# Patient Record
Sex: Female | Born: 1985 | Hispanic: Yes | Marital: Married | Smoking: Former smoker
Health system: Southern US, Community
[De-identification: ages and names within clinical notes are randomized; demographics above are authoritative.]

## PROBLEM LIST (undated history)

## (undated) DIAGNOSIS — Z22330 Carrier of Group B streptococcus: Secondary | ICD-10-CM

## (undated) DIAGNOSIS — K219 Gastro-esophageal reflux disease without esophagitis: Secondary | ICD-10-CM

## (undated) DIAGNOSIS — E559 Vitamin D deficiency, unspecified: Secondary | ICD-10-CM

## (undated) DIAGNOSIS — F419 Anxiety disorder, unspecified: Secondary | ICD-10-CM

## (undated) DIAGNOSIS — K5900 Constipation, unspecified: Secondary | ICD-10-CM

## (undated) DIAGNOSIS — B977 Papillomavirus as the cause of diseases classified elsewhere: Secondary | ICD-10-CM

## (undated) DIAGNOSIS — Z8619 Personal history of other infectious and parasitic diseases: Secondary | ICD-10-CM

## (undated) DIAGNOSIS — N760 Acute vaginitis: Secondary | ICD-10-CM

## (undated) DIAGNOSIS — R896 Abnormal cytological findings in specimens from other organs, systems and tissues: Secondary | ICD-10-CM

## (undated) DIAGNOSIS — IMO0002 Reserved for concepts with insufficient information to code with codable children: Secondary | ICD-10-CM

## (undated) DIAGNOSIS — Z87898 Personal history of other specified conditions: Secondary | ICD-10-CM

## (undated) DIAGNOSIS — A630 Anogenital (venereal) warts: Secondary | ICD-10-CM

## (undated) DIAGNOSIS — B9689 Other specified bacterial agents as the cause of diseases classified elsewhere: Secondary | ICD-10-CM

## (undated) DIAGNOSIS — N926 Irregular menstruation, unspecified: Secondary | ICD-10-CM

## (undated) HISTORY — DX: Papillomavirus as the cause of diseases classified elsewhere: B97.7

## (undated) HISTORY — DX: Other specified bacterial agents as the cause of diseases classified elsewhere: B96.89

## (undated) HISTORY — DX: Abnormal cytological findings in specimens from other organs, systems and tissues: R89.6

## (undated) HISTORY — DX: Constipation, unspecified: K59.00

## (undated) HISTORY — DX: Carrier of group B Streptococcus: Z22.330

## (undated) HISTORY — DX: Acute vaginitis: N76.0

## (undated) HISTORY — DX: Vitamin D deficiency, unspecified: E55.9

## (undated) HISTORY — DX: Anxiety disorder, unspecified: F41.9

## (undated) HISTORY — DX: Irregular menstruation, unspecified: N92.6

## (undated) HISTORY — DX: Reserved for concepts with insufficient information to code with codable children: IMO0002

## (undated) HISTORY — PX: WISDOM TOOTH EXTRACTION: SHX21

## (undated) HISTORY — DX: Personal history of other specified conditions: Z87.898

## (undated) HISTORY — DX: Gastro-esophageal reflux disease without esophagitis: K21.9

## (undated) HISTORY — DX: Personal history of other infectious and parasitic diseases: Z86.19

## (undated) HISTORY — DX: Anogenital (venereal) warts: A63.0

---
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
## 2007-04-20 ENCOUNTER — Inpatient Hospital Stay (HOSPITAL_COMMUNITY): Admission: AD | Admit: 2007-04-20 | Discharge: 2007-04-22 | Payer: Self-pay | Admitting: Family Medicine

## 2007-10-02 DIAGNOSIS — N760 Acute vaginitis: Secondary | ICD-10-CM | POA: Insufficient documentation

## 2007-10-02 DIAGNOSIS — B9689 Other specified bacterial agents as the cause of diseases classified elsewhere: Secondary | ICD-10-CM

## 2007-10-02 HISTORY — DX: Other specified bacterial agents as the cause of diseases classified elsewhere: B96.89

## 2007-10-02 HISTORY — DX: Other specified bacterial agents as the cause of diseases classified elsewhere: N76.0

## 2008-08-23 DIAGNOSIS — A630 Anogenital (venereal) warts: Secondary | ICD-10-CM

## 2008-08-23 HISTORY — DX: Anogenital (venereal) warts: A63.0

## 2009-01-17 HISTORY — PX: COLPOSCOPY: SHX161

## 2010-04-10 ENCOUNTER — Emergency Department (HOSPITAL_COMMUNITY): Admission: EM | Admit: 2010-04-10 | Discharge: 2010-04-10 | Payer: Self-pay | Admitting: Emergency Medicine

## 2010-11-06 LAB — WET PREP, GENITAL
Trich, Wet Prep: NONE SEEN
Yeast Wet Prep HPF POC: NONE SEEN

## 2010-11-06 LAB — GC/CHLAMYDIA PROBE AMP, GENITAL: GC Probe Amp, Genital: NEGATIVE

## 2010-11-06 LAB — POCT PREGNANCY, URINE: Preg Test, Ur: NEGATIVE
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
## 2011-01-08 NOTE — Op Note (Signed)
Sandra Alvarado, Sandra Alvarado                ACCOUNT NO.:  [id number]   MEDICAL RECORD NO.:  [id number]          PATIENT TYPE:  OIB   LOCATION:  2859                         FACILITY:  [facility]   PHYSICIAN:  Jon Gills, M.D.  DATE OF BIRTH:  [birth year]   DATE OF PROCEDURE:  12/25/2004  DATE OF DISCHARGE:  12/25/2004                                 OPERATIVE REPORT   PREOPERATIVE DIAGNOSIS:  Epigastric abdominal pain.   POSTOPERATIVE DIAGNOSIS:  Epigastric abdominal pain.   NAME OF OPERATION:  Upper GI endoscopy with biopsy.   SURGEON:  Jon Gills, M.D.   ASSISTANTS:  None.   DESCRIPTION OF FINDINGS:  Following informed written consent, the patient  was taken to the operating room and placed under general anesthesia, with  continuous cardiopulmonary monitoring.  She remained in the supine position,  and the Olympus endoscope was advanced without difficulty.  There was no  endoscopic evidence for gastritis, duodenitis, esophagitis, or peptic ulcer  disease.  A solitary gastric biopsy was negative for Helicobacter.  Several  duodenal biopsies were unremarkable.  The endoscope was gradually withdrawn,  and the patient was taken to the recovery room in satisfactory condition.  She will be released to the care of her parents later today.   DESCRIPTION OF TECHNICAL PROCEDURES USED:  Olympus GIF-160 endoscope with  cold biopsy forceps.   DESCRIPTION OF SPECIMENS REMOVED:  1.  Gastric x 1 for CLO.  2.  Duodenal x 4 for histology.      JHC/MEDQ  D:  01/08/2005  T:  01/08/2005  Job:  [id number]   cc:   Caleb Popp, MD  Fax: [phone]

## 2011-01-23 ENCOUNTER — Inpatient Hospital Stay (INDEPENDENT_AMBULATORY_CARE_PROVIDER_SITE_OTHER)
Admission: RE | Admit: 2011-01-23 | Discharge: 2011-01-23 | Disposition: A | Discharge status: Home / Self Care | Payer: 59 | Source: Ambulatory Visit | Attending: Family Medicine | Admitting: Family Medicine

## 2011-01-23 DIAGNOSIS — N12 Tubulo-interstitial nephritis, not specified as acute or chronic: Secondary | ICD-10-CM

## 2011-01-23 LAB — POCT URINALYSIS DIP (DEVICE)
Glucose, UA: NEGATIVE mg/dL
Protein, ur: NEGATIVE mg/dL
Specific Gravity, Urine: 1.01 (ref 1.005–1.030)
pH: 5 (ref 5.0–8.0)

## 2011-01-23 LAB — DIFFERENTIAL
Basophils Absolute: 0 10*3/uL (ref 0.0–0.1)
Basophils Relative: 0 % (ref 0–1)
Eosinophils Absolute: 0 10*3/uL (ref 0.0–0.7)
Monocytes Absolute: 0.2 10*3/uL (ref 0.1–1.0)
Neutro Abs: 11.8 10*3/uL — ABNORMAL HIGH (ref 1.7–7.7)
Neutrophils Relative %: 94 % — ABNORMAL HIGH (ref 43–77)

## 2011-01-23 LAB — CBC
HCT: 36.5 % (ref 36.0–46.0)
Hemoglobin: 12.4 g/dL (ref 12.0–15.0)
MCH: 30.7 pg (ref 26.0–34.0)
MCV: 90.3 fL (ref 78.0–100.0)

## 2011-01-23 LAB — POCT PREGNANCY, URINE: Preg Test, Ur: NEGATIVE

## 2011-01-25 ENCOUNTER — Inpatient Hospital Stay (HOSPITAL_COMMUNITY)
Admission: RE | Admit: 2011-01-25 | Discharge: 2011-01-25 | Disposition: A | Discharge status: Home / Self Care | Payer: 59 | Source: Ambulatory Visit | Attending: Family Medicine | Admitting: Family Medicine

## 2011-01-26 LAB — URINE CULTURE: Culture  Setup Time: 201206030043

## 2011-06-04 LAB — CBC
HCT: 33.3 — ABNORMAL LOW
Hemoglobin: 12.8
MCHC: 33.9
RBC: 3.68 — ABNORMAL LOW
RBC: 4.17

## 2011-06-04 LAB — RPR: RPR Ser Ql: NONREACTIVE

## 2012-02-08 ENCOUNTER — Encounter: Payer: Self-pay | Admitting: Obstetrics and Gynecology

## 2012-02-08 ENCOUNTER — Ambulatory Visit (INDEPENDENT_AMBULATORY_CARE_PROVIDER_SITE_OTHER): Payer: 59 | Admitting: Obstetrics and Gynecology

## 2012-02-08 ENCOUNTER — Telehealth: Payer: Self-pay | Admitting: Obstetrics and Gynecology

## 2012-02-08 VITALS — BP 92/60 | Temp 98.0°F | Ht 61.75 in

## 2012-02-08 DIAGNOSIS — A499 Bacterial infection, unspecified: Secondary | ICD-10-CM

## 2012-02-08 DIAGNOSIS — A630 Anogenital (venereal) warts: Secondary | ICD-10-CM

## 2012-02-08 DIAGNOSIS — Z309 Encounter for contraceptive management, unspecified: Secondary | ICD-10-CM

## 2012-02-08 DIAGNOSIS — N926 Irregular menstruation, unspecified: Secondary | ICD-10-CM | POA: Insufficient documentation

## 2012-02-08 DIAGNOSIS — R87610 Atypical squamous cells of undetermined significance on cytologic smear of cervix (ASC-US): Secondary | ICD-10-CM

## 2012-02-08 DIAGNOSIS — N390 Urinary tract infection, site not specified: Secondary | ICD-10-CM

## 2012-02-08 DIAGNOSIS — Z8619 Personal history of other infectious and parasitic diseases: Secondary | ICD-10-CM

## 2012-02-08 DIAGNOSIS — N760 Acute vaginitis: Secondary | ICD-10-CM

## 2012-02-08 DIAGNOSIS — K219 Gastro-esophageal reflux disease without esophagitis: Secondary | ICD-10-CM

## 2012-02-08 DIAGNOSIS — Z113 Encounter for screening for infections with a predominantly sexual mode of transmission: Secondary | ICD-10-CM

## 2012-02-08 DIAGNOSIS — B9689 Other specified bacterial agents as the cause of diseases classified elsewhere: Secondary | ICD-10-CM

## 2012-02-08 DIAGNOSIS — IMO0001 Reserved for inherently not codable concepts without codable children: Secondary | ICD-10-CM | POA: Insufficient documentation

## 2012-02-08 DIAGNOSIS — Z30432 Encounter for removal of intrauterine contraceptive device: Secondary | ICD-10-CM

## 2012-02-08 DIAGNOSIS — Z22330 Carrier of Group B streptococcus: Secondary | ICD-10-CM

## 2012-02-08 DIAGNOSIS — B977 Papillomavirus as the cause of diseases classified elsewhere: Secondary | ICD-10-CM | POA: Insufficient documentation

## 2012-02-08 LAB — POCT URINALYSIS DIPSTICK
Bilirubin, UA: NEGATIVE
Glucose, UA: NEGATIVE
Urobilinogen, UA: NEGATIVE

## 2012-02-08 MED ORDER — CIPROFLOXACIN HCL 500 MG PO TABS: 500.0000 mg | ORAL_TABLET | Freq: Two times a day (BID) | ORAL | Status: AC

## 2012-02-08 NOTE — Progress Notes (Signed)
26 YO for IUD removal as it is due to be removed in October, to be checked for a UTI  (pressure with urination and dysuria x 2 days) and is requesting STD testing. Denies vaginitis symptoms or pelvic pain.  Does not want contraception   O: U/A= SG=1.020, pH=5,  leuk->3, nitrite +,  protein 1+, blood 2+    UPT-negative  Abdomen: soft, non-tender Pelvic: EGBUS-wnl, vagina-normal rugae, scant blood, cervix- IUD removed without difficulty (cervix displaced to the right), uterus-normal size, adnexae-no tenderness   A:  IUD Removed      UTI  P: Patient declines contraception       Cipro 500 mg #6 bid x 3 days       MVI with 400 mcg of folate       RTO-AEx or prn  Sandra Jorgenson, PA-C

## 2012-02-08 NOTE — Telephone Encounter (Signed)
Tc to pt per telephone call. Pt with questions rgdg diagnosis found on AVS sheet today. Questions answered rgdg HPV and GBS. Pt voices understanding.

## 2012-02-08 NOTE — Patient Instructions (Signed)
Take  Multivitamin with 400 mcg of folate or more

## 2012-02-08 NOTE — Telephone Encounter (Signed)
Triage/ph had appt today

## 2012-02-09 LAB — HEPATITIS B SURFACE ANTIGEN: Hepatitis B Surface Ag: NEGATIVE

## 2012-02-09 LAB — GC/CHLAMYDIA PROBE AMP, GENITAL: Chlamydia, DNA Probe: NEGATIVE

## 2012-02-09 LAB — HIV ANTIBODY (ROUTINE TESTING W REFLEX): HIV: NONREACTIVE

## 2012-02-12 LAB — URINE CULTURE

## 2012-02-22 ENCOUNTER — Telehealth: Payer: Self-pay | Admitting: Obstetrics and Gynecology

## 2012-02-22 NOTE — Telephone Encounter (Signed)
Lm for pt to call back

## 2012-02-22 NOTE — Telephone Encounter (Signed)
Triage/res. °

## 2012-02-25 ENCOUNTER — Telehealth: Payer: Self-pay | Admitting: Obstetrics and Gynecology

## 2012-02-25 NOTE — Telephone Encounter (Signed)
TC TO PT CONCERNING LAB RESULTS. INFORMED PT THAT ALL TEST WERE WNL EXCEPT FOR URINE CULTURE WHICH SHE HAS ALREADY BEEN TREATED FOR. PT VOICED UNDERSTANDING.

## 2012-09-07 ENCOUNTER — Ambulatory Visit: Payer: 59 | Admitting: Obstetrics and Gynecology

## 2012-09-07 ENCOUNTER — Encounter: Payer: Self-pay | Admitting: Obstetrics and Gynecology

## 2012-09-07 VITALS — BP 92/62 | Temp 98.5°F | Ht 61.0 in | Wt 130.0 lb

## 2012-09-07 DIAGNOSIS — N870 Mild cervical dysplasia: Secondary | ICD-10-CM

## 2012-09-07 DIAGNOSIS — IMO0002 Reserved for concepts with insufficient information to code with codable children: Secondary | ICD-10-CM

## 2012-09-07 NOTE — Progress Notes (Signed)
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  Patient has also requested to be tested for infertility because she has not conceived after not having used any contraception.  Since October cycle is 25-30 day cycle, menses 5 days with protection change twice a day, cramps rated as a 4-5/10 on a 10 point pain scale-relieved with Tylenol   O: Pelvic: EGBUS-wnl. vagina-normal, cervix-slightly posterior with no lesions, uterus/adnexae-normal  A: Repeat PAP     CIN-1     Perceived infertility  P:  PAP- sent       Reviewed infertility w/u:  SA, HSG, TSH/PRL & day 21 Progesterone       RTO-as needed or scheduled  Jhovanny Guinta, AP-C

## 2012-09-08 LAB — PAP IG W/ RFLX HPV ASCU

## 2012-09-13 ENCOUNTER — Telehealth: Payer: Self-pay

## 2012-09-13 NOTE — Telephone Encounter (Signed)
Pt called to schedule colpo. Pt not ava. LMOVM for pt to call back.

## 2012-09-13 NOTE — Telephone Encounter (Signed)
colpo scheduled for 10/10/12  Pt agreeable  LC CMA

## 2012-09-23 DIAGNOSIS — R87619 Unspecified abnormal cytological findings in specimens from cervix uteri: Secondary | ICD-10-CM

## 2012-09-23 DIAGNOSIS — IMO0002 Reserved for concepts with insufficient information to code with codable children: Secondary | ICD-10-CM

## 2012-09-23 HISTORY — DX: Reserved for concepts with insufficient information to code with codable children: IMO0002

## 2012-09-23 HISTORY — DX: Unspecified abnormal cytological findings in specimens from cervix uteri: R87.619

## 2012-10-10 ENCOUNTER — Encounter: Payer: Self-pay | Admitting: Obstetrics and Gynecology

## 2012-10-10 ENCOUNTER — Ambulatory Visit: Payer: 59 | Admitting: Obstetrics and Gynecology

## 2012-10-10 VITALS — BP 100/54 | Ht 61.5 in | Wt 129.0 lb

## 2012-10-10 DIAGNOSIS — B977 Papillomavirus as the cause of diseases classified elsewhere: Secondary | ICD-10-CM

## 2012-10-10 DIAGNOSIS — Z3169 Encounter for other general counseling and advice on procreation: Secondary | ICD-10-CM

## 2012-10-10 DIAGNOSIS — IMO0001 Reserved for inherently not codable concepts without codable children: Secondary | ICD-10-CM

## 2012-10-10 NOTE — Progress Notes (Signed)
HISTORY OF PRESENT ILLNESS  [name] is a 27 y.o. year old female,G1P1001, who presents for a problem visit.  In 2010 the patient had colposcopically directed biopsies that showed CIN-1 and HPV infection.  Her Pap smear from January 2014 showed ascus with high risk HPV.  The patient has tried to get pregnant for the last 9 months without success.  Subjective:  The patient is more concerned about her abnormal Pap smear  Objective:  BP 100/54  Ht 5' 1.5" (1.562 m)  Wt 129 lb (58.514 kg)  BMI 23.98 kg/m2  LMP 09/21/2012   General: hirsutism GI: soft and nontender  External genitalia: normal general appearance Vaginal: normal without tenderness, induration or masses Cervix: see colposcopy note Adnexa: normal bimanual exam Uterus: normal size shape and consistency  Pregnancy test: Negative  COLPOSCOPY NOTE:  The colposcopy procedure was explained.  The patient's questions were answered. A speculum exam was performed.  The cervix was prepped with acetic acid and Hurricaine gel.  The cervix was evaluated using a white light and the green filter. Findings: white epithelium at 10:00 .  The endocervical canal was clear.  No lesions were seen.  Biopsies obtained: biopsy at 10:00.  Hemostasis was adequate.  An endocervical curettage was performed.  Again, hemostasis was adequate. The procedure was terminated.  The patient tolerated her procedure well.  The specimens were sent to pathology.  Assessment:  Ascus Pap smear  History of CIN-1  HPV  Infertility  Plan:  Biopsy at 10:00 and ECC sent to pathology.  HPV discussed with patient and her husband.  Infertility reviewed.  Management outlined.  Return to office in 2 weeks to discuss test results.  Leonard Schwartz M.D.  10/10/2012 6:15 PM

## 2012-10-19 ENCOUNTER — Encounter: Payer: Self-pay | Admitting: Obstetrics and Gynecology

## 2012-10-24 ENCOUNTER — Encounter: Payer: Self-pay | Admitting: Obstetrics and Gynecology

## 2012-10-24 ENCOUNTER — Ambulatory Visit: Payer: 59 | Admitting: Obstetrics and Gynecology

## 2012-10-24 VITALS — BP 100/70 | Wt 131.0 lb

## 2012-10-24 DIAGNOSIS — N870 Mild cervical dysplasia: Secondary | ICD-10-CM

## 2012-10-24 NOTE — Progress Notes (Signed)
27 YO with recent colposcopy for LGSIL with pathology results showing CIN-I & HPV returns for results.  Reviewed with patient and her partner HPV and abnormal PAP follow up for her abnormality.  A: CIN-I  P: Repeat PAP in 6 months       Urged MVI with 400 mcg of folic acid or more (couple is not contracepting)       RTO-as scheduled or prn  POWELL,ELMIRA, PA-C

## 2014-06-24 ENCOUNTER — Encounter: Payer: Self-pay | Admitting: Obstetrics and Gynecology

## 2014-12-24 ENCOUNTER — Other Ambulatory Visit: Payer: Self-pay | Admitting: Obstetrics and Gynecology

## 2015-03-19 ENCOUNTER — Other Ambulatory Visit: Payer: Self-pay | Admitting: Obstetrics and Gynecology

## 2015-03-19 ENCOUNTER — Encounter (HOSPITAL_COMMUNITY): Payer: Self-pay | Admitting: *Deleted

## 2015-03-21 ENCOUNTER — Ambulatory Visit (HOSPITAL_COMMUNITY)
Admission: RE | Admit: 2015-03-21 | Discharge: 2015-03-21 | Disposition: A | Discharge status: Home / Self Care | Payer: 59 | Source: Ambulatory Visit | Attending: Obstetrics and Gynecology | Admitting: Obstetrics and Gynecology

## 2015-03-21 ENCOUNTER — Ambulatory Visit (HOSPITAL_COMMUNITY): Payer: 59

## 2015-03-21 ENCOUNTER — Encounter (HOSPITAL_COMMUNITY): Payer: Self-pay

## 2015-03-21 ENCOUNTER — Encounter (HOSPITAL_COMMUNITY)
Admission: RE | Disposition: A | Discharge status: Home / Self Care | Payer: Self-pay | Source: Ambulatory Visit | Attending: Obstetrics and Gynecology

## 2015-03-21 ENCOUNTER — Ambulatory Visit (HOSPITAL_COMMUNITY): Payer: 59 | Admitting: Anesthesiology

## 2015-03-21 DIAGNOSIS — O021 Missed abortion: Secondary | ICD-10-CM | POA: Diagnosis present

## 2015-03-21 DIAGNOSIS — Z22330 Carrier of Group B streptococcus: Secondary | ICD-10-CM | POA: Diagnosis not present

## 2015-03-21 DIAGNOSIS — O029 Abnormal product of conception, unspecified: Secondary | ICD-10-CM

## 2015-03-21 DIAGNOSIS — R87610 Atypical squamous cells of undetermined significance on cytologic smear of cervix (ASC-US): Secondary | ICD-10-CM | POA: Diagnosis not present

## 2015-03-21 DIAGNOSIS — K219 Gastro-esophageal reflux disease without esophagitis: Secondary | ICD-10-CM | POA: Insufficient documentation

## 2015-03-21 DIAGNOSIS — Z87891 Personal history of nicotine dependence: Secondary | ICD-10-CM | POA: Diagnosis not present

## 2015-03-21 HISTORY — PX: DILATION AND EVACUATION: SHX1459

## 2015-03-21 LAB — CBC
HCT: 38 % (ref 36.0–46.0)
Hemoglobin: 13.3 g/dL (ref 12.0–15.0)
MCH: 31.3 pg (ref 26.0–34.0)
MCHC: 35 g/dL (ref 30.0–36.0)
MCV: 89.4 fL (ref 78.0–100.0)
Platelets: 228 10*3/uL (ref 150–400)
RBC: 4.25 MIL/uL (ref 3.87–5.11)
RDW: 12.2 % (ref 11.5–15.5)
WBC: 7 10*3/uL (ref 4.0–10.5)

## 2015-03-21 SURGERY — DILATION AND EVACUATION, UTERUS
Anesthesia: Monitor Anesthesia Care

## 2015-03-21 MED ORDER — EPHEDRINE 5 MG/ML INJ
INTRAVENOUS | Status: AC
  Filled 2015-03-21: qty 10

## 2015-03-21 MED ORDER — DEXAMETHASONE SODIUM PHOSPHATE 4 MG/ML IJ SOLN
INTRAMUSCULAR | Status: AC
  Filled 2015-03-21: qty 1

## 2015-03-21 MED ORDER — DEXAMETHASONE SODIUM PHOSPHATE 4 MG/ML IJ SOLN
INTRAMUSCULAR | Status: DC | PRN
  Administered 2015-03-21: 4 mg via INTRAVENOUS

## 2015-03-21 MED ORDER — PROPOFOL INFUSION 10 MG/ML OPTIME
INTRAVENOUS | Status: DC | PRN
  Administered 2015-03-21: 10 mL via INTRAVENOUS
  Administered 2015-03-21: 20 mL via INTRAVENOUS
  Administered 2015-03-21: 30 mL via INTRAVENOUS
  Administered 2015-03-21 (×2): 20 mL via INTRAVENOUS
  Administered 2015-03-21: 30 mL via INTRAVENOUS
  Administered 2015-03-21: 20 mL via INTRAVENOUS
  Administered 2015-03-21 (×2): 10 mL via INTRAVENOUS
  Administered 2015-03-21: 30 mL via INTRAVENOUS

## 2015-03-21 MED ORDER — LIDOCAINE HCL 2 % IJ SOLN
INTRAMUSCULAR | Status: AC
  Filled 2015-03-21: qty 20

## 2015-03-21 MED ORDER — MIDAZOLAM HCL 2 MG/2ML IJ SOLN
INTRAMUSCULAR | Status: DC | PRN
  Administered 2015-03-21: 2 mg via INTRAVENOUS

## 2015-03-21 MED ORDER — ONDANSETRON HCL 4 MG/2ML IJ SOLN
INTRAMUSCULAR | Status: AC
  Filled 2015-03-21: qty 2

## 2015-03-21 MED ORDER — PROMETHAZINE HCL 25 MG/ML IJ SOLN
6.2500 mg | INTRAMUSCULAR | Status: DC | PRN
Start: 2015-03-21 — End: 2015-03-21

## 2015-03-21 MED ORDER — SCOPOLAMINE 1 MG/3DAYS TD PT72
MEDICATED_PATCH | TRANSDERMAL | Status: AC
  Administered 2015-03-21: 1.5 mg via TRANSDERMAL
  Filled 2015-03-21: qty 1

## 2015-03-21 MED ORDER — MEPERIDINE HCL 25 MG/ML IJ SOLN: 6.2500 mg | INTRAMUSCULAR | Status: DC | PRN

## 2015-03-21 MED ORDER — DOXYCYCLINE HYCLATE 50 MG PO CAPS: 100.0000 mg | ORAL_CAPSULE | Freq: Two times a day (BID) | ORAL | Status: DC

## 2015-03-21 MED ORDER — FENTANYL CITRATE (PF) 100 MCG/2ML IJ SOLN
INTRAMUSCULAR | Status: DC | PRN
  Administered 2015-03-21 (×2): 25 ug via INTRAVENOUS
  Administered 2015-03-21: 50 ug via INTRAVENOUS

## 2015-03-21 MED ORDER — PROPOFOL 10 MG/ML IV BOLUS
INTRAVENOUS | Status: AC
  Filled 2015-03-21: qty 20

## 2015-03-21 MED ORDER — HYDROCODONE-ACETAMINOPHEN 5-325 MG PO TABS: 1.0000 | ORAL_TABLET | Freq: Four times a day (QID) | ORAL | Status: DC | PRN

## 2015-03-21 MED ORDER — ONDANSETRON HCL 4 MG/2ML IJ SOLN
INTRAMUSCULAR | Status: DC | PRN
  Administered 2015-03-21: 4 mg via INTRAVENOUS

## 2015-03-21 MED ORDER — PHENYLEPHRINE 40 MCG/ML (10ML) SYRINGE FOR IV PUSH (FOR BLOOD PRESSURE SUPPORT)
PREFILLED_SYRINGE | INTRAVENOUS | Status: AC
  Filled 2015-03-21: qty 10

## 2015-03-21 MED ORDER — MIDAZOLAM HCL 2 MG/2ML IJ SOLN
INTRAMUSCULAR | Status: AC
  Filled 2015-03-21: qty 2

## 2015-03-21 MED ORDER — FENTANYL CITRATE (PF) 100 MCG/2ML IJ SOLN
INTRAMUSCULAR | Status: AC
  Filled 2015-03-21: qty 2

## 2015-03-21 MED ORDER — LACTATED RINGERS IV SOLN
INTRAVENOUS | Status: DC
  Administered 2015-03-21 (×4): via INTRAVENOUS

## 2015-03-21 MED ORDER — IBUPROFEN 600 MG PO TABS: 600.0000 mg | ORAL_TABLET | Freq: Four times a day (QID) | ORAL | Status: DC | PRN

## 2015-03-21 MED ORDER — LIDOCAINE HCL (CARDIAC) 20 MG/ML IV SOLN
INTRAVENOUS | Status: AC
  Filled 2015-03-21: qty 5

## 2015-03-21 MED ORDER — PROPOFOL 10 MG/ML IV BOLUS
INTRAVENOUS | Status: AC
  Filled 2015-03-21: qty 40

## 2015-03-21 MED ORDER — SCOPOLAMINE 1 MG/3DAYS TD PT72
1.0000 | MEDICATED_PATCH | Freq: Once | TRANSDERMAL | Status: DC
  Administered 2015-03-21: 1.5 mg via TRANSDERMAL

## 2015-03-21 MED ORDER — LIDOCAINE HCL 2 % IJ SOLN
INTRAMUSCULAR | Status: DC | PRN
  Administered 2015-03-21: 20 mL

## 2015-03-21 MED ORDER — KETOROLAC TROMETHAMINE 30 MG/ML IJ SOLN
INTRAMUSCULAR | Status: AC
  Filled 2015-03-21: qty 1

## 2015-03-21 MED ORDER — KETOROLAC TROMETHAMINE 30 MG/ML IJ SOLN
INTRAMUSCULAR | Status: DC | PRN
  Administered 2015-03-21: 30 mg via INTRAVENOUS

## 2015-03-21 MED ORDER — FENTANYL CITRATE (PF) 100 MCG/2ML IJ SOLN
25.0000 ug | INTRAMUSCULAR | Status: DC | PRN
  Administered 2015-03-21: 50 ug via INTRAVENOUS

## 2015-03-21 MED ORDER — LIDOCAINE HCL (CARDIAC) 20 MG/ML IV SOLN
INTRAVENOUS | Status: DC | PRN
  Administered 2015-03-21: 20 mg via INTRAVENOUS

## 2015-03-21 SURGICAL SUPPLY — 20 items
CATH ROBINSON RED A/P 16FR (CATHETERS) ×2 IMPLANT
CLOTH BEACON ORANGE TIMEOUT ST (SAFETY) ×2 IMPLANT
DECANTER SPIKE VIAL GLASS SM (MISCELLANEOUS) ×2 IMPLANT
DILATOR CANAL MILEX (MISCELLANEOUS) IMPLANT
GLOVE BIO SURGEON STRL SZ 6.5 (GLOVE) ×2 IMPLANT
GLOVE BIOGEL PI IND STRL 7.0 (GLOVE) ×2 IMPLANT
GLOVE BIOGEL PI INDICATOR 7.0 (GLOVE) ×2
GOWN STRL REUS W/TWL LRG LVL3 (GOWN DISPOSABLE) ×4 IMPLANT
KIT BERKELEY 1ST TRIMESTER 3/8 (MISCELLANEOUS) ×2 IMPLANT
NS IRRIG 1000ML POUR BTL (IV SOLUTION) ×2 IMPLANT
PACK VAGINAL MINOR WOMEN LF (CUSTOM PROCEDURE TRAY) ×2 IMPLANT
PAD OB MATERNITY 4.3X12.25 (PERSONAL CARE ITEMS) ×2 IMPLANT
PAD PREP 24X48 CUFFED NSTRL (MISCELLANEOUS) ×2 IMPLANT
SET BERKELEY SUCTION TUBING (SUCTIONS) ×2 IMPLANT
SYR 20CC LL (SYRINGE) ×2 IMPLANT
TOWEL OR 17X24 6PK STRL BLUE (TOWEL DISPOSABLE) ×4 IMPLANT
VACURETTE 10 RIGID CVD (CANNULA) IMPLANT
VACURETTE 7MM CVD STRL WRAP (CANNULA) ×2 IMPLANT
VACURETTE 8 RIGID CVD (CANNULA) IMPLANT
VACURETTE 9 RIGID CVD (CANNULA) IMPLANT

## 2015-03-21 NOTE — Anesthesia Preprocedure Evaluation (Signed)
Anesthesia Evaluation  Patient identified by MRN, date of birth, ID band Patient awake    Reviewed: Allergy & Precautions, NPO status , Patient's Chart, lab work & pertinent test results, reviewed documented beta blocker date and time   Airway Mallampati: II   Neck ROM: Full    Dental  (+) Dental Advisory Given   Pulmonary former smoker (quit 01/06/2015 ?),  breath sounds clear to auscultation        Cardiovascular negative cardio ROS  Rhythm:Regular     Neuro/Psych    GI/Hepatic GERD-  ,  Endo/Other    Renal/GU      Musculoskeletal   Abdominal (+)  Abdomen: soft.    Peds  Hematology   Anesthesia Other Findings   Reproductive/Obstetrics LMP 01/01/2015                             Anesthesia Physical Anesthesia Plan  ASA: II  Anesthesia Plan: MAC   Post-op Pain Management:    Induction: Intravenous  Airway Management Planned: Mask and Nasal Cannula  Additional Equipment:   Intra-op Plan:   Post-operative Plan:   Informed Consent: I have reviewed the patients History and Physical, chart, labs and discussed the procedure including the risks, benefits and alternatives for the proposed anesthesia with the patient or authorized representative who has indicated his/her understanding and acceptance.     Plan Discussed with:   Anesthesia Plan Comments:         Anesthesia Quick Evaluation

## 2015-03-21 NOTE — Discharge Instructions (Signed)
Blighted Ovum A blighted ovum (anembryonic pregnancy) happens when a fertilized egg (embryo) attaches itself to the uterine wall, but the embryo does not develop. The pregnancy sac (placenta) continues to grow even though the embryo does not grow and develop.The pregnancy hormone is still secreted because the placenta has formed. This will result in a positive pregnancy test despite having an abnormal pregnancy. A blighted ovum occurs within the first trimester, sometimes before a woman knows she is pregnant.  CAUSES A blighted ovum is usually the result of chromosomal problems. This can be caused by abnormal cell division or poor quality sperm or egg. SYMPTOMS Early on, signs of pregnancy may be experienced, such as:  A missed menstrual period.  Fatigue.  Feeling sick to your stomach (nauseous).  Sore breasts.  A positive pregnancy test. Then, signs of miscarriage may develop, such as:  Abdominal cramps.  Vaginal bleeding or spotting.  A menstrual period that is heavier than usual. DIAGNOSIS The diagnosis of a blighted ovum is made with an ultrasound test that shows an empty uterus or an empty gestational sac. TREATMENT Your caregiver will help you decide what the best treatment is for you. Treatment for a blighted ovum includes:   Letting your body naturally pass the tissue of a blighted ovum.  Taking medicine to trigger the miscarriage.  Having a procedure called a dilation and curettage (D&C) to remove the placental tissues.  A D&C may be helpful if you would like the tissue examined to determine the reason for a miscarriage. Talk to your caregiver about the risks involved with this procedure. HOME CARE INSTRUCTIONS   Follow up with your caregiver to make sure that your pregnancy hormone returns to zero.  Wait at least 1 to 3 regular menstrual cycles before trying to get pregnant again, or as recommended by your caregiver. SEEK IMMEDIATE MEDICAL CARE IF:  You have  worsening abdominal pain.  You have very heavy bleeding or use 1 to 2 pads every hour, for more than 2 hours.  You are dizzy, feel faint, or pass out. Document Released: 11/24/2010 Document Revised: 11/01/2011 Document Reviewed: 11/24/2010 [facility] Patient Information 2015 [place], [place]. This information is not intended to replace advice given to you by your health care provider. Make sure you discuss any questions you have with your health care provider. DISCHARGE INSTRUCTIONS: D&C / D&E The following instructions have been prepared to help you care for yourself upon your return home.   Personal hygiene:  Use sanitary pads for vaginal drainage, not tampons.  Shower the day after your procedure.  NO tub baths, pools or Jacuzzis for 2-3 weeks.  Wipe front to back after using the bathroom.  Activity and limitations:  Do NOT drive or operate any equipment for 24 hours. The effects of anesthesia are still present and drowsiness may result.  Do NOT rest in bed all day.  Walking is encouraged.  Walk up and down stairs slowly.  You may resume your normal activity in one to two days or as indicated by your physician.  Sexual activity: NO intercourse for at least 2 weeks after the procedure, or as indicated by your physician.  Diet: Eat a light meal as desired this evening. You may resume your usual diet tomorrow.  Return to work: You may resume your work activities in one to two days or as indicated by your doctor.  What to expect after your surgery: Expect to have vaginal bleeding/discharge for 2-3 days and spotting for up to 10  days. It is not unusual to have soreness for up to 1-2 weeks. You may have a slight burning sensation when you urinate for the first day. Mild cramps may continue for a couple of days. You may have a regular period in 2-6 weeks.  Call your doctor for any of the following:  Excessive vaginal bleeding, saturating and changing one pad every hour.   Inability to urinate 6 hours after discharge from hospital.  Pain not relieved by pain medication.  Fever of 100.4 F or greater.  Unusual vaginal discharge or odor.   Call for an appointment:    Patients signature: ______________________  Nurses signature ________________________  Support person's signature_______________________    Post Anesthesia Home Care Instructions  Activity: Get plenty of rest for the remainder of the day. A responsible adult should stay with you for 24 hours following the procedure.  For the next 24 hours, DO NOT: -Drive a car -Advertising copywriter -Drink alcoholic beverages -Take any medication unless instructed by your physician -Make any legal decisions or sign important papers.  Meals: Start with liquid foods such as gelatin or soup. Progress to regular foods as tolerated. Avoid greasy, spicy, heavy foods. If nausea and/or vomiting occur, drink only clear liquids until the nausea and/or vomiting subsides. Call your physician if vomiting continues.  Special Instructions/Symptoms: Your throat may feel dry or sore from the anesthesia or the breathing tube placed in your throat during surgery. If this causes discomfort, gargle with warm salt water. The discomfort should disappear within 24 hours.  If you had a scopolamine patch placed behind your ear for the management of post- operative nausea and/or vomiting:  1. The medication in the patch is effective for 72 hours, after which it should be removed.  Wrap patch in a tissue and discard in the trash. Wash hands thoroughly with soap and water. 2. You may remove the patch earlier than 72 hours if you experience unpleasant side effects which may include dry mouth, dizziness or visual disturbances. 3. Avoid touching the patch. Wash your hands with soap and water after contact with the patch.

## 2015-03-21 NOTE — Op Note (Signed)
Pre op DX :  Missed abortion Postoperative Diagnosis: same Procedure:  dilation and evacuation Anesthesia:  MAC and local Surgeon:  Dr. Normand Sloop Asst : none Complications : none Procedure in detail: The patient was taken to the operating room where she was given MAC anesthesia. She was placed in dorsal lithotomy position and prepped and draped in normal sterile fashion. In and out catheter was used to drain the bladder. This was examined and noted to have a 7 week size uterus with no adnexal masses. A bivalve was placed into the vagina. A tenaculum was placed on the cervix. The cervix was infiltrated with 20 cc 1% lidocaine paracervical block. The cervix then dilated with dilators up to 21.  A size 7 suction curettage was placed into the uterine cavity. A scant amount of products of conception was seen. The suction curettage was removed when a gritty texture was noted. A sharp curette was done along all walls  of the uterus. The suction curet was placed back into the uterine cavity. No further products of conception were obtained. Sponge lap and needle counts were correct. Patient back in stable condition. The patient understood to be the risks to be, but not  limited to,  Bleeding,  Infection,  damage to internal organs by perforation of the uterus and Asherman syndrome (scarring in the uterus) leading to infertility.  The procedure was done with US guidance

## 2015-03-21 NOTE — H&P (Signed)
Sandra Alvarado is an 29 y.o. female. Presenting for D&E because of missed abortion  Pertinent Gynecological History: Menses: flow is moderate Bleeding: NA Contraception: none DES exposure: denies Blood transfusions: none Sexually transmitted diseases: no past history Previous GYN Procedures: none  Last mammogram: NA Date:  Last pap: abnormal: CIN1 Date: 2014 OB History: G2, P1   Menstrual History: Menarche age: 71  Patient's last menstrual period was 01/01/2015.    Past Medical History  Diagnosis Date  . ASCUS (atypical squamous cells of undetermined significance) on Pap smear 2008  . 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 GBS carrier   . H/O candidiasis   . H/O varicella   . Acid reflux   . Irregular periods/menstrual cycles   . H/O nausea 10/2006  . BV (bacterial vaginosis) 10/02/07  . Condyloma 2010  . Abnormal Pap smear 09/2012    Colposcopy = CIN-I    Past Surgical History  Procedure Laterality Date  . Wisdom tooth extraction    . Colposcopy  01/17/09    Family History  Problem Relation Age of Onset  . COPD Brother     Bronchitis  . Hypertension Maternal Grandmother   . Thyroid disease Maternal Grandmother   . Arthritis Maternal Grandmother   . Hypertension Paternal Grandmother   . Diabetes Paternal Grandmother     Type 2  . Hypertension Father   . Hyperlipidemia Father   . Thyroid disease Mother     Social History:  reports that she quit smoking about 2 months ago. Her smoking use included Cigarettes. She has never used smokeless tobacco. She reports that she drinks alcohol. She reports that she does not use illicit drugs.  Allergies: No Known Allergies  Prescriptions prior to admission  Medication Sig Dispense Refill Last Dose  . acetaminophen (TYLENOL) 325 MG tablet Take 650 mg by mouth every 6 (six) hours as needed for headache.   03/20/2015 at 2300  . FIBER SELECT GUMMIES CHEW Chew 2 tablets by mouth daily.   Past Week at Unknown time  . Multiple Vitamin  (MULTIVITAMIN WITH MINERALS) TABS tablet Take 1 tablet by mouth daily.   Past Week at Unknown time    ROS  Blood pressure 110/51, pulse 79, temperature 98.4 F (36.9 C), temperature source Oral, resp. rate 18, height  (1.549 m), weight 157 lb (71.215 kg), last menstrual period 01/01/2015, SpO2 100 %. Physical Exam Physical Examination: General appearance - alert, well appearing, and in no distress Heart - normal rate and regular rhythm Abdomen - soft, nontender, nondistended, no masses or organomegaly Pelvic - normal external genitalia, vulva, vagina, cervix, uterus and adnexa Extremities - peripheral pulses normal, no pedal edema, no clubbing or cyanosis  No results found for this or any previous visit (from the past 24 hour(s)).  No results found.  Assessment/Plan: Missed abortion Blood type AB positive Pt offered, D&E, observation or cytotec Pt chose D&E.  She understands the risks are bleeding, infection, damage to internal organs bc of perforation and ashermans syndrome.    Sandra Alvarado A 03/21/2015, 12:05 PM

## 2015-03-21 NOTE — Transfer of Care (Signed)
Immediate Anesthesia Transfer of Care Note  Patient: Sandra Alvarado  Procedure(s) Performed: Procedure(s): DILATATION AND EVACUATION Ultrasound Guided (N/A)  Patient Location: PACU  Anesthesia Type:MAC combined with regional for post-op pain  Level of Consciousness: awake, alert , oriented and patient cooperative  Airway & Oxygen Therapy: Patient Spontanous Breathing  Post-op Assessment: Report given to RN and Post -op Vital signs reviewed and stable  Post vital signs: Reviewed and stable  Last Vitals:  TEMP 98.5 BP 118/70 HR 102 O2SAT 98 RR 16  Complications: No apparent anesthesia complications

## 2015-03-21 NOTE — Anesthesia Postprocedure Evaluation (Signed)
  Anesthesia Post-op Note  Patient: Sandra Alvarado  Procedure(s) Performed: Procedure(s): DILATATION AND EVACUATION Ultrasound Guided (N/A)  Patient Location: PACU  Anesthesia Type:MAC  Level of Consciousness: awake and alert   Airway and Oxygen Therapy: Patient Spontanous Breathing  Post-op Pain: mild  Post-op Assessment: Post-op Vital signs reviewed, Patient's Cardiovascular Status Stable, Respiratory Function Stable, Patent Airway and No signs of Nausea or vomiting              Post-op Vital Signs: Reviewed and stable  Last Vitals:  Filed Vitals:   03/21/15 1400  BP: 105/52  Pulse: 72  Temp:   Resp: 15    Complications: No apparent anesthesia complications

## 2015-03-24 ENCOUNTER — Encounter (HOSPITAL_COMMUNITY): Payer: Self-pay | Admitting: Obstetrics and Gynecology

## 2016-05-05 ENCOUNTER — Other Ambulatory Visit: Payer: Self-pay | Admitting: Obstetrics and Gynecology

## 2016-09-29 DIAGNOSIS — J09X2 Influenza due to identified novel influenza A virus with other respiratory manifestations: Secondary | ICD-10-CM | POA: Diagnosis not present

## 2016-09-29 DIAGNOSIS — R05 Cough: Secondary | ICD-10-CM | POA: Diagnosis not present

## 2016-11-25 DIAGNOSIS — R635 Abnormal weight gain: Secondary | ICD-10-CM | POA: Diagnosis not present

## 2017-01-20 DIAGNOSIS — Z202 Contact with and (suspected) exposure to infections with a predominantly sexual mode of transmission: Secondary | ICD-10-CM | POA: Diagnosis not present

## 2017-01-21 DIAGNOSIS — B9689 Other specified bacterial agents as the cause of diseases classified elsewhere: Secondary | ICD-10-CM | POA: Diagnosis not present

## 2017-01-21 DIAGNOSIS — E559 Vitamin D deficiency, unspecified: Secondary | ICD-10-CM | POA: Diagnosis not present

## 2017-01-21 DIAGNOSIS — Z113 Encounter for screening for infections with a predominantly sexual mode of transmission: Secondary | ICD-10-CM | POA: Diagnosis not present

## 2017-01-21 DIAGNOSIS — Z202 Contact with and (suspected) exposure to infections with a predominantly sexual mode of transmission: Secondary | ICD-10-CM | POA: Diagnosis not present

## 2017-01-21 DIAGNOSIS — N760 Acute vaginitis: Secondary | ICD-10-CM | POA: Diagnosis not present

## 2017-02-20 LAB — HM PAP SMEAR

## 2017-03-05 DIAGNOSIS — Z0000 Encounter for general adult medical examination without abnormal findings: Secondary | ICD-10-CM | POA: Diagnosis not present

## 2017-03-14 DIAGNOSIS — E559 Vitamin D deficiency, unspecified: Secondary | ICD-10-CM | POA: Diagnosis not present

## 2017-03-14 DIAGNOSIS — H938X3 Other specified disorders of ear, bilateral: Secondary | ICD-10-CM | POA: Diagnosis not present

## 2017-03-14 DIAGNOSIS — Z0000 Encounter for general adult medical examination without abnormal findings: Secondary | ICD-10-CM | POA: Diagnosis not present

## 2017-03-14 DIAGNOSIS — Z23 Encounter for immunization: Secondary | ICD-10-CM | POA: Diagnosis not present

## 2017-03-14 DIAGNOSIS — E786 Lipoprotein deficiency: Secondary | ICD-10-CM | POA: Diagnosis not present

## 2017-04-04 DIAGNOSIS — Z124 Encounter for screening for malignant neoplasm of cervix: Secondary | ICD-10-CM | POA: Diagnosis not present

## 2017-04-04 DIAGNOSIS — Z01411 Encounter for gynecological examination (general) (routine) with abnormal findings: Secondary | ICD-10-CM | POA: Diagnosis not present

## 2017-04-04 DIAGNOSIS — Z113 Encounter for screening for infections with a predominantly sexual mode of transmission: Secondary | ICD-10-CM | POA: Diagnosis not present

## 2017-04-04 DIAGNOSIS — Z6830 Body mass index (BMI) 30.0-30.9, adult: Secondary | ICD-10-CM | POA: Diagnosis not present

## 2017-06-01 DIAGNOSIS — N898 Other specified noninflammatory disorders of vagina: Secondary | ICD-10-CM | POA: Diagnosis not present

## 2017-06-01 DIAGNOSIS — A64 Unspecified sexually transmitted disease: Secondary | ICD-10-CM | POA: Diagnosis not present

## 2017-06-02 DIAGNOSIS — N898 Other specified noninflammatory disorders of vagina: Secondary | ICD-10-CM | POA: Diagnosis not present

## 2017-06-06 DIAGNOSIS — Z113 Encounter for screening for infections with a predominantly sexual mode of transmission: Secondary | ICD-10-CM | POA: Diagnosis not present

## 2017-06-08 DIAGNOSIS — Z113 Encounter for screening for infections with a predominantly sexual mode of transmission: Secondary | ICD-10-CM | POA: Diagnosis not present

## 2017-06-24 DIAGNOSIS — R102 Pelvic and perineal pain: Secondary | ICD-10-CM | POA: Diagnosis not present

## 2017-06-24 DIAGNOSIS — B356 Tinea cruris: Secondary | ICD-10-CM | POA: Diagnosis not present

## 2017-06-24 DIAGNOSIS — N898 Other specified noninflammatory disorders of vagina: Secondary | ICD-10-CM | POA: Diagnosis not present

## 2017-06-30 DIAGNOSIS — Z113 Encounter for screening for infections with a predominantly sexual mode of transmission: Secondary | ICD-10-CM | POA: Diagnosis not present

## 2017-06-30 DIAGNOSIS — N898 Other specified noninflammatory disorders of vagina: Secondary | ICD-10-CM | POA: Diagnosis not present

## 2017-07-18 ENCOUNTER — Ambulatory Visit (INDEPENDENT_AMBULATORY_CARE_PROVIDER_SITE_OTHER): Payer: BLUE CROSS/BLUE SHIELD | Admitting: Family

## 2017-07-18 ENCOUNTER — Encounter: Payer: Self-pay | Admitting: Family

## 2017-07-18 VITALS — BP 113/60 | HR 85 | Temp 98.6°F | Resp 16 | Ht 61.5 in | Wt 164.8 lb

## 2017-07-18 DIAGNOSIS — H6120 Impacted cerumen, unspecified ear: Secondary | ICD-10-CM

## 2017-07-18 DIAGNOSIS — F4310 Post-traumatic stress disorder, unspecified: Secondary | ICD-10-CM | POA: Diagnosis not present

## 2017-07-18 DIAGNOSIS — Z8619 Personal history of other infectious and parasitic diseases: Secondary | ICD-10-CM | POA: Diagnosis not present

## 2017-07-18 DIAGNOSIS — F4311 Post-traumatic stress disorder, acute: Secondary | ICD-10-CM

## 2017-07-18 DIAGNOSIS — L304 Erythema intertrigo: Secondary | ICD-10-CM

## 2017-07-18 MED ORDER — NYSTATIN 100000 UNIT/GM EX POWD: Freq: Two times a day (BID) | CUTANEOUS | 1 refills | Status: DC
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
## 2017-07-18 NOTE — Patient Instructions (Addendum)
Please consider establishing with a counselor- [phone] Apply nystatin powder 1-2 times daily to groin as needed.  Welcome to [facility]!

## 2017-07-20 DIAGNOSIS — F4311 Post-traumatic stress disorder, acute: Secondary | ICD-10-CM | POA: Insufficient documentation

## 2017-07-20 DIAGNOSIS — F4310 Post-traumatic stress disorder, unspecified: Secondary | ICD-10-CM | POA: Insufficient documentation

## 2017-07-20 NOTE — Assessment & Plan Note (Signed)
I suggested that she establish with a therapist for counseling.  She is agreeable. Support provided.

## 2017-07-20 NOTE — Assessment & Plan Note (Signed)
Clinically stable currently.

## 2017-07-21 NOTE — Addendum Note (Signed)
Addended by: [name]'SULLIVAN, Coyle Stordahl on: 07/21/2017 09:45 AM   Modules accepted: Level of Service

## 2017-07-21 NOTE — Addendum Note (Signed)
Addended by: [name]'SULLIVAN, Israella Hubert on: 07/21/2017 10:36 AM   Modules accepted: Level of Service

## 2017-07-27 DIAGNOSIS — Z113 Encounter for screening for infections with a predominantly sexual mode of transmission: Secondary | ICD-10-CM | POA: Diagnosis not present

## 2017-07-27 DIAGNOSIS — N898 Other specified noninflammatory disorders of vagina: Secondary | ICD-10-CM | POA: Diagnosis not present

## 2017-07-27 DIAGNOSIS — M545 Low back pain: Secondary | ICD-10-CM | POA: Diagnosis not present

## 2017-07-27 DIAGNOSIS — B373 Candidiasis of vulva and vagina: Secondary | ICD-10-CM | POA: Diagnosis not present

## 2017-09-11 ENCOUNTER — Encounter (HOSPITAL_BASED_OUTPATIENT_CLINIC_OR_DEPARTMENT_OTHER): Payer: Self-pay | Admitting: Emergency Medicine

## 2017-09-11 ENCOUNTER — Other Ambulatory Visit: Payer: Self-pay

## 2017-09-11 ENCOUNTER — Emergency Department (HOSPITAL_BASED_OUTPATIENT_CLINIC_OR_DEPARTMENT_OTHER)
Admission: EM | Admit: 2017-09-11 | Discharge: 2017-09-12 | Disposition: A | Discharge status: Home / Self Care | Payer: BLUE CROSS/BLUE SHIELD | Attending: Emergency Medicine | Admitting: Emergency Medicine

## 2017-09-11 DIAGNOSIS — Y929 Unspecified place or not applicable: Secondary | ICD-10-CM | POA: Insufficient documentation

## 2017-09-11 DIAGNOSIS — S199XXA Unspecified injury of neck, initial encounter: Secondary | ICD-10-CM | POA: Diagnosis not present

## 2017-09-11 DIAGNOSIS — Y999 Unspecified external cause status: Secondary | ICD-10-CM | POA: Diagnosis not present

## 2017-09-11 DIAGNOSIS — Z87891 Personal history of nicotine dependence: Secondary | ICD-10-CM | POA: Insufficient documentation

## 2017-09-11 DIAGNOSIS — Y9389 Activity, other specified: Secondary | ICD-10-CM | POA: Insufficient documentation

## 2017-09-11 DIAGNOSIS — M542 Cervicalgia: Secondary | ICD-10-CM | POA: Diagnosis not present

## 2017-09-11 DIAGNOSIS — M549 Dorsalgia, unspecified: Secondary | ICD-10-CM | POA: Diagnosis not present

## 2017-09-11 LAB — URINALYSIS, ROUTINE W REFLEX MICROSCOPIC
Bilirubin Urine: NEGATIVE
Glucose, UA: NEGATIVE mg/dL
HGB URINE DIPSTICK: NEGATIVE
Ketones, ur: 15 mg/dL — AB
LEUKOCYTES UA: NEGATIVE
Nitrite: NEGATIVE
Protein, ur: NEGATIVE mg/dL
Specific Gravity, Urine: 1.02 (ref 1.005–1.030)
pH: 7.5 (ref 5.0–8.0)

## 2017-09-11 LAB — PREGNANCY, URINE: PREG TEST UR: NEGATIVE
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
## 2017-09-12 ENCOUNTER — Emergency Department (HOSPITAL_BASED_OUTPATIENT_CLINIC_OR_DEPARTMENT_OTHER): Payer: BLUE CROSS/BLUE SHIELD

## 2017-09-12 DIAGNOSIS — M542 Cervicalgia: Secondary | ICD-10-CM | POA: Diagnosis not present

## 2017-09-12 DIAGNOSIS — S199XXA Unspecified injury of neck, initial encounter: Secondary | ICD-10-CM | POA: Diagnosis not present

## 2017-09-12 MED ORDER — CYCLOBENZAPRINE HCL 5 MG PO TABS: 5.0000 mg | ORAL_TABLET | Freq: Two times a day (BID) | ORAL | 0 refills | Status: DC | PRN

## 2017-09-12 MED ORDER — IBUPROFEN 600 MG PO TABS: 600.0000 mg | ORAL_TABLET | Freq: Four times a day (QID) | ORAL | 0 refills | Status: DC | PRN

## 2017-09-12 MED ORDER — IBUPROFEN 200 MG PO TABS
600.0000 mg | ORAL_TABLET | Freq: Once | ORAL | Status: AC
  Administered 2017-09-12: 600 mg via ORAL
  Filled 2017-09-12: qty 1

## 2017-09-12 MED ORDER — CYCLOBENZAPRINE HCL 5 MG PO TABS
5.0000 mg | ORAL_TABLET | Freq: Once | ORAL | Status: AC
  Administered 2017-09-12: 5 mg via ORAL
  Filled 2017-09-12: qty 1

## 2017-09-12 NOTE — ED Notes (Signed)
Pt give d/c instructions as per chart. Rx x 2 with precautions. Verbalizes understanding. No questions.
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
## 2017-09-15 ENCOUNTER — Encounter: Payer: Self-pay | Admitting: Family

## 2017-09-15 ENCOUNTER — Ambulatory Visit (INDEPENDENT_AMBULATORY_CARE_PROVIDER_SITE_OTHER): Payer: BLUE CROSS/BLUE SHIELD | Admitting: Family

## 2017-09-15 VITALS — BP 100/48 | HR 66 | Temp 98.7°F | Resp 16 | Ht 61.0 in | Wt 167.4 lb

## 2017-09-15 DIAGNOSIS — M7918 Myalgia, other site: Secondary | ICD-10-CM | POA: Diagnosis not present

## 2017-09-15 DIAGNOSIS — S134XXA Sprain of ligaments of cervical spine, initial encounter: Secondary | ICD-10-CM | POA: Diagnosis not present
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
## 2017-09-15 NOTE — Patient Instructions (Signed)
Please continue ibuprofen and flexeril as needed. You may also apply heat and salon pas patches as needed. Call if symptoms worsen or if not improved in 3 weeks.

## 2017-09-23 ENCOUNTER — Encounter: Payer: Self-pay | Admitting: Family

## 2017-09-23 ENCOUNTER — Ambulatory Visit: Payer: BLUE CROSS/BLUE SHIELD | Admitting: Family

## 2017-09-23 ENCOUNTER — Other Ambulatory Visit (HOSPITAL_COMMUNITY)
Admission: RE | Admit: 2017-09-23 | Discharge: 2017-09-23 | Disposition: A | Discharge status: Home / Self Care | Payer: BLUE CROSS/BLUE SHIELD | Source: Ambulatory Visit | Attending: Family | Admitting: Family

## 2017-09-23 VITALS — BP 131/65 | HR 121 | Temp 100.8°F | Resp 16 | Ht 61.0 in | Wt 167.0 lb

## 2017-09-23 DIAGNOSIS — N730 Acute parametritis and pelvic cellulitis: Secondary | ICD-10-CM | POA: Diagnosis not present

## 2017-09-23 DIAGNOSIS — N898 Other specified noninflammatory disorders of vagina: Secondary | ICD-10-CM | POA: Insufficient documentation

## 2017-09-23 DIAGNOSIS — R509 Fever, unspecified: Secondary | ICD-10-CM

## 2017-09-23 DIAGNOSIS — R350 Frequency of micturition: Secondary | ICD-10-CM

## 2017-09-23 DIAGNOSIS — Z114 Encounter for screening for human immunodeficiency virus [HIV]: Secondary | ICD-10-CM

## 2017-09-23 LAB — POCT INFLUENZA A/B
INFLUENZA A, POC: NEGATIVE
INFLUENZA B, POC: NEGATIVE

## 2017-09-23 LAB — POC URINALSYSI DIPSTICK (AUTOMATED)
Bilirubin, UA: NEGATIVE
GLUCOSE UA: NEGATIVE
Ketones, UA: NEGATIVE
Leukocytes, UA: NEGATIVE
NITRITE UA: NEGATIVE
Protein, UA: NEGATIVE
Spec Grav, UA: >=1.03 — AB (ref 1.010–1.025)
Urobilinogen, UA: 0.2 E.U./dL
pH, UA: 6 (ref 5.0–8.0)

## 2017-09-23 MED ORDER — DOXYCYCLINE HYCLATE 100 MG PO TABS: 100.0000 mg | ORAL_TABLET | Freq: Two times a day (BID) | ORAL | 0 refills | Status: DC

## 2017-09-23 MED ORDER — CEFTRIAXONE SODIUM 250 MG IJ SOLR
250.0000 mg | Freq: Once | INTRAMUSCULAR | Status: AC
  Administered 2017-09-23: 250 mg via INTRAMUSCULAR

## 2017-09-23 NOTE — Patient Instructions (Signed)
Please complete lab work prior to leaving. Begin doxycycline (antibiotic). Go to ER if you develop worsening pelvic pain or fever >101 after starting antibiotics.
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
## 2017-09-24 LAB — URINE CULTURE
MICRO NUMBER:: 90139918
SPECIMEN QUALITY: ADEQUATE

## 2017-09-26 ENCOUNTER — Telehealth: Payer: Self-pay | Admitting: *Deleted

## 2017-09-26 LAB — HEPATITIS PANEL, ACUTE
Hep A IgM: NONREACTIVE
Hep B C IgM: NONREACTIVE
Hepatitis B Surface Ag: NONREACTIVE
Hepatitis C Ab: NONREACTIVE
SIGNAL TO CUT-OFF: 0.01 (ref <1.00)

## 2017-09-26 LAB — CERVICOVAGINAL ANCILLARY ONLY
BACTERIAL VAGINITIS: NEGATIVE
CANDIDA VAGINITIS: NEGATIVE
Chlamydia: NEGATIVE
Neisseria Gonorrhea: NEGATIVE
Trichomonas: NEGATIVE

## 2017-09-26 LAB — RPR: RPR: NONREACTIVE

## 2017-09-26 LAB — HIV ANTIBODY (ROUTINE TESTING W REFLEX): HIV: NONREACTIVE

## 2017-09-26 LAB — HSV 2 ANTIBODY, IGG: HSV 2 Glycoprotein G Ab, IgG: <0.9 index

## 2017-09-26 NOTE — Telephone Encounter (Signed)
Please advise?  Copied from CRM [id number]. Topic: Quick Communication - Lab Results >> Sep 26, 2017 11:26 AM Alexander BergeronBarksdale, Harvey B wrote: Contact pt when lab results are ready

## 2017-09-27 NOTE — Telephone Encounter (Signed)
Please let pt know that her STD testing is all negative including negative HIV.

## 2017-09-27 NOTE — Telephone Encounter (Addendum)
Notified pt of below. She states she continues to have vaginal discharge and urinary urgency. Denies dysuria or frequency. Back pain and fever have resolved. Pt wanted to know if she should continue antibiotic and she was advied to take the full course. Pt wants to know what is causing her symptoms? Why is she still having discharge if results are negative? Discussed possible PID per recent OV and pt seems doubtful of this diagnosis.  Please advise?

## 2017-09-27 NOTE — Telephone Encounter (Signed)
Complete abx, not sure at this point what is causing her symptoms however I think we should get her in with GYN. please place referral to GYN.

## 2017-09-28 NOTE — Telephone Encounter (Signed)
Attempted to reach pt and left message to check mychart. Message sent. 

## 2018-03-03 ENCOUNTER — Encounter: Payer: BLUE CROSS/BLUE SHIELD | Admitting: Family

## 2018-03-06 ENCOUNTER — Encounter: Payer: BLUE CROSS/BLUE SHIELD | Admitting: Family

## 2018-04-13 DIAGNOSIS — Z113 Encounter for screening for infections with a predominantly sexual mode of transmission: Secondary | ICD-10-CM | POA: Diagnosis not present

## 2018-04-13 DIAGNOSIS — R87612 Low grade squamous intraepithelial lesion on cytologic smear of cervix (LGSIL): Secondary | ICD-10-CM | POA: Diagnosis not present

## 2018-04-13 DIAGNOSIS — R87610 Atypical squamous cells of undetermined significance on cytologic smear of cervix (ASC-US): Secondary | ICD-10-CM | POA: Diagnosis not present

## 2018-04-13 DIAGNOSIS — Z01411 Encounter for gynecological examination (general) (routine) with abnormal findings: Secondary | ICD-10-CM | POA: Diagnosis not present

## 2018-04-13 DIAGNOSIS — Z6832 Body mass index (BMI) 32.0-32.9, adult: Secondary | ICD-10-CM | POA: Diagnosis not present

## 2018-06-05 DIAGNOSIS — R350 Frequency of micturition: Secondary | ICD-10-CM | POA: Diagnosis not present

## 2018-06-05 DIAGNOSIS — B373 Candidiasis of vulva and vagina: Secondary | ICD-10-CM | POA: Diagnosis not present

## 2018-06-08 DIAGNOSIS — N898 Other specified noninflammatory disorders of vagina: Secondary | ICD-10-CM | POA: Diagnosis not present

## 2018-06-08 DIAGNOSIS — B373 Candidiasis of vulva and vagina: Secondary | ICD-10-CM | POA: Diagnosis not present

## 2018-06-20 DIAGNOSIS — B373 Candidiasis of vulva and vagina: Secondary | ICD-10-CM | POA: Diagnosis not present

## 2018-06-20 DIAGNOSIS — N898 Other specified noninflammatory disorders of vagina: Secondary | ICD-10-CM | POA: Diagnosis not present

## 2018-06-20 DIAGNOSIS — Z113 Encounter for screening for infections with a predominantly sexual mode of transmission: Secondary | ICD-10-CM | POA: Diagnosis not present

## 2018-06-20 DIAGNOSIS — N926 Irregular menstruation, unspecified: Secondary | ICD-10-CM | POA: Diagnosis not present

## 2018-07-24 DIAGNOSIS — L720 Epidermal cyst: Secondary | ICD-10-CM | POA: Diagnosis not present

## 2018-10-04 ENCOUNTER — Telehealth: Payer: Self-pay | Admitting: Medical

## 2018-10-04 ENCOUNTER — Ambulatory Visit (INDEPENDENT_AMBULATORY_CARE_PROVIDER_SITE_OTHER): Payer: BLUE CROSS/BLUE SHIELD | Admitting: Medical

## 2018-10-04 VITALS — BP 105/62 | HR 74 | Temp 98.2°F | Resp 16 | Ht 61.0 in | Wt 180.0 lb

## 2018-10-04 DIAGNOSIS — L089 Local infection of the skin and subcutaneous tissue, unspecified: Secondary | ICD-10-CM

## 2018-10-04 MED ORDER — AMOXICILLIN-POT CLAVULANATE 875-125 MG PO TABS: 1.0000 | ORAL_TABLET | Freq: Two times a day (BID) | ORAL | 0 refills | Status: DC

## 2018-10-04 NOTE — Telephone Encounter (Signed)
Please put in vitals for this pt. Closed chart and stated not done.

## 2018-10-04 NOTE — Patient Instructions (Signed)
You do appear to have recent right ear small infection at the upper ear cartilage piercing site.  Some faint hyperpigmentation and swelling present on exam.  I will give Augmentin antibiotic and we discussed possible allergic reaction to metals as well as foreign bodies that can cause infections in the skin.  Conservative advice would be to remove the earring completely and let the hole close up.  You expressed desire to keep the piercing.  So every 3 days while on antibiotics can keep the earring in place for 24 hours.  Clean the earring before replacing.  Then recommend following up in 10 days to see if the hyperpigmentation and swelling has resolved.  If over the next 10 days area swells again as before then recommend taking out the earring and coming in for recheck.

## 2018-10-04 NOTE — Progress Notes (Signed)
Subjective:    Patient ID: Sandra Alvarado, female    DOB: [birth year], 33 y.o.   MRN: [id number]  HPI   Pt had rt upper earring area redness and swelling. Pt had piercing aug 2018. No issues until 3 weeks ago. Started to get red swollen area around earring. He husband evaluated area. Took out earring, cleaned area and swelling decreased. But still little tender.  Husband states she in the past sometimes causes allergies to gold.  Necklace metal that she wears around neck.  She took out earring for 5 days. The swelling came down but still hyperpigmented. Husband cleared area and put thread in area for a couple of days.  Pt leaves earring in areas a lot.   LMP- September 09, 2018.     Review of Systems  HENT: Positive for congestion, sinus pressure and sinus pain. Negative for drooling, ear pain and mouth sores.        See hpi.  Also some sinus pressure on and off about 3 weeks ago.  Respiratory: Negative for choking, chest tightness, shortness of breath and wheezing.   Cardiovascular: Negative for chest pain and palpitations.  Gastrointestinal: Negative for abdominal pain.  Musculoskeletal: Negative for back pain, myalgias and neck stiffness.  Skin: Negative for color change, rash and wound.       See hpi.  Neurological: Negative for syncope, facial asymmetry, weakness and numbness.  Hematological: Negative for adenopathy. Does not bruise/bleed easily.  Psychiatric/Behavioral: Negative for agitation, confusion and dysphoric mood. The patient is not hyperactive.     Past Medical History:  Diagnosis Date  . Abnormal Pap smear 09/2012   Colposcopy = CIN-I  . Acid reflux   . ASCUS (atypical squamous cells of undetermined significance) on Pap smear 2008  . BV (bacterial vaginosis) 10/02/07  . Condyloma 2010  . GBS carrier   . H/O candidiasis   . H/O nausea 10/2006  . H/O varicella   . 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 History of chicken pox   . History of herpes simplex infection     . Irregular periods/menstrual cycles      Social History   Socioeconomic History  . Marital status: Single    Spouse name: Not on file  . Number of children: Not on file  . Years of education: Not on file  . Highest education level: Not on file  Occupational History  . Not on file  Social Needs  . Financial resource strain: Not on file  . Food insecurity:    Worry: Not on file    Inability: Not on file  . Transportation needs:    Medical: Not on file    Non-medical: Not on file  Tobacco Use  . Smoking status: Former Smoker    Types: Cigarettes    Last attempt to quit: 01/13/2015    Years since quitting: 3.7  . Smokeless tobacco: Never Used  Substance and Sexual Activity  . Alcohol use: Yes    Comment: OCCASIONAL  . Drug use: No  . Sexual activity: Yes    Birth control/protection: None  Lifestyle  . Physical activity:    Days per week: Not on file    Minutes per session: Not on file  . Stress: Not on file  Relationships  . Social connections:    Talks on phone: Not on file    Gets together: Not on file    Attends religious service: Not on file    Active member of  club or organization: Not on file    Attends meetings of clubs or organizations: Not on file    Relationship status: Not on file  . Intimate partner violence:    Fear of current or ex partner: Not on file    Emotionally abused: Not on file    Physically abused: Not on file    Forced sexual activity: Not on file  Other Topics Concern  . Not on file  Social History Narrative   Lives with her daughter (2008) and her husband.     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 COLPOSCOPY  01/17/09  . DILATION AND EVACUATION N/A 03/21/2015   Procedure:  DILATATION AND EVACUATION Ultrasound Guided;  Surgeon: Jaymes GraffNaima Dillard, MD;  Location: [facility];  Service: Gynecology;  Laterality: N/A;  . WISDOM TOOTH EXTRACTION      Family History  Problem Relation Age of Onset  . Hypertension Maternal Grandmother   . Thyroid disease Maternal Grandmother   . Arthritis Maternal Grandmother   . Depression Maternal Grandmother   . Hyperlipidemia Paternal Grandmother   . Hypertension Father   . Hyperlipidemia Father   . Thyroid disease Mother   . Diabetes Mother        type II  . Alcohol abuse Maternal Grandfather   . Alcohol abuse Paternal Grandfather     No Known Allergies  No current outpatient medications on file prior to visit.   No current facility-administered medications on file prior to visit.     There were no vitals taken for this visit.      Objective:   Physical Exam  General  Mental Status - Alert. General Appearance - Well groomed. Not in acute distress.  Skin Rashes- No Rashes.(of rt ear upper cartlidge area at site of piercing looks mild swollen with hyperpigmentation at piercing site)  HEENT Head- Normal. Ear Auditory Canal - Left- Normal. Right - Normal.Tympanic Membrane- Left- Normal. Right- Normal. Eye Sclera/Conjunctiva- Left- Normal. Right- Normal. Nose & Sinuses Nasal Mucosa- Left-  Boggy and Congested. Right-  Boggy and  Congested.Bilateral maxillary and frontal sinus pressure. Mouth & Throat Lips: Upper Lip- Normal: no dryness, cracking, pallor, cyanosis, or vesicular eruption. Lower Lip-Normal: no dryness, cracking, pallor, cyanosis or vesicular eruption. Buccal Mucosa- Bilateral- No Aphthous ulcers. Oropharynx- No Discharge or Erythema. Tonsils: Characteristics- Bilateral- No Erythema or Congestion. Size/Enlargement- Bilateral- No enlargement. Discharge- bilateral-None.  Neck Neck- Supple. No Masses.   Chest and Lung Exam Auscultation: Breath Sounds:-Clear even and  unlabored.  Cardiovascular Auscultation:Rythm- Regular, rate and rhythm. Murmurs & Other Heart Sounds:Ausculatation of the heart reveal- No Murmurs.  Lymphatic Head & Neck General Head & Neck Lymphatics: Bilateral: Description- No Localized lymphadenopathy.       Assessment & Plan:  You do appear to have recent right ear small infection at the upper ear cartilage piercing site.  Some faint hyperpigmentation and swelling present on exam.  I will give Augmentin antibiotic and we discussed possible allergic reaction to metals as well as foreign bodies that can cause infections in the skin.  Conservative advice would be to remove the earring completely and let the hole close up.  You expressed desire to keep the piercing.  So every  3 days while on antibiotics can keep the earring in place for 24 hours.  Clean the earring before replacing.  Then recommend following up in 10 days to see if the hyperpigmentation and swelling has resolved.  If over the next 10 days area swells again as before then recommend taking out the earring and coming in for recheck.  Esperanza Richters, PA-C

## 2018-10-05 ENCOUNTER — Encounter: Payer: Self-pay | Admitting: Medical

## 2018-10-05 NOTE — Telephone Encounter (Signed)
Done

## 2018-11-13 ENCOUNTER — Other Ambulatory Visit: Payer: Self-pay

## 2018-11-13 ENCOUNTER — Telehealth (INDEPENDENT_AMBULATORY_CARE_PROVIDER_SITE_OTHER): Payer: BLUE CROSS/BLUE SHIELD | Admitting: Family Medicine

## 2018-11-13 ENCOUNTER — Telehealth: Payer: Self-pay | Admitting: Family

## 2018-11-13 DIAGNOSIS — R05 Cough: Secondary | ICD-10-CM

## 2018-11-13 DIAGNOSIS — J209 Acute bronchitis, unspecified: Secondary | ICD-10-CM | POA: Diagnosis not present

## 2018-11-13 DIAGNOSIS — R059 Cough, unspecified: Secondary | ICD-10-CM

## 2018-11-13 MED ORDER — AZITHROMYCIN 250 MG PO TABS: ORAL_TABLET | ORAL | 0 refills | Status: DC

## 2018-11-13 MED ORDER — BENZONATATE 100 MG PO CAPS: 100.0000 mg | ORAL_CAPSULE | Freq: Three times a day (TID) | ORAL | 0 refills | Status: DC | PRN

## 2018-11-13 NOTE — Telephone Encounter (Signed)
FYI

## 2018-11-13 NOTE — Telephone Encounter (Signed)
Copied from CRM [id number]. Topic: General - Other >> Nov 13, 2018  3:55 PM Jaquita Rector A wrote: Reason for CRM: Patient called back was wondering if she missed a call from Dr Patsy Lager stated she was to have received a call at 3.20 pm. She was made aware that Dr Patsy Lager had not gotten to her yet. Please advise Ph# [phone]
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
## 2018-11-24 ENCOUNTER — Ambulatory Visit: Payer: Self-pay

## 2018-11-24 NOTE — Telephone Encounter (Signed)
Routed incorrectly  °

## 2018-11-24 NOTE — Telephone Encounter (Signed)
Pt c/o non productive cough for 1 month. Pt having frequent cough along with coughing spells that make her SOB. Pt stated she catches her breath after recoving from the coughing episode. Pt denies SOB, chest pain,  Or any other symptoms. Pt was evaluated in March and was prescribed Z  pack. Pt has tried robitussin, cough drops  With little effect. Care advice given and pt verbalized understanding. E-mail address verified.   Reason for Disposition . Cough has been present for > 3 weeks  Answer Assessment - Initial Assessment Questions 1. ONSET: "When did the cough begin?"      First of March 2. SEVERITY: "How bad is the cough today?"      Frequent  Hard cough, coughing fits 3. RESPIRATORY DISTRESS: "Describe your breathing."      Is sob after coughing fit 4. FEVER: "Do you have a fever?" If so, ask: "What is your temperature, how was it measured, and when did it start?"    no 5. HEMOPTYSIS: "Are you coughing up any blood?" If so ask: "How much?" (flecks, streaks, tablespoons, etc.)     no 6. TREATMENT: "What have you done so far to treat the cough?" (e.g., meds, fluids, humidifier)     z pack, robitussin, cough drops 7. CARDIAC HISTORY: "Do you have any history of heart disease?" (e.g., heart attack, congestive heart failure)      no 8. LUNG HISTORY: "Do you have any history of lung disease?"  (e.g., pulmonary embolus, asthma, emphysema)     no 9. PE RISK FACTORS: "Do you have a history of blood clots?" (or: recent major surgery, recent prolonged travel, bedridden)     no 10. OTHER SYMPTOMS: "Do you have any other symptoms? (e.g., runny nose, wheezing, chest pain)       no 11. PREGNANCY: "Is there any chance you are pregnant?" "When was your last menstrual period?"       No LMP: March 15 th 12. TRAVEL: "Have you traveled out of the country in the last month?" (e.g., travel history, exposures)      no  Protocols used: COUGH - ACUTE NON-PRODUCTIVE-A-AH

## 2018-11-27 ENCOUNTER — Ambulatory Visit (INDEPENDENT_AMBULATORY_CARE_PROVIDER_SITE_OTHER): Payer: BLUE CROSS/BLUE SHIELD | Admitting: Family

## 2018-11-27 ENCOUNTER — Other Ambulatory Visit: Payer: Self-pay

## 2018-11-27 DIAGNOSIS — R05 Cough: Secondary | ICD-10-CM

## 2018-11-27 DIAGNOSIS — R059 Cough, unspecified: Secondary | ICD-10-CM

## 2018-11-27 MED ORDER — OMEPRAZOLE 40 MG PO CPDR: 40.0000 mg | DELAYED_RELEASE_CAPSULE | Freq: Every day | ORAL | 3 refills | Status: DC

## 2018-11-27 MED ORDER — ALBUTEROL SULFATE 108 (90 BASE) MCG/ACT IN AEPB: 2.0000 | INHALATION_SPRAY | Freq: Four times a day (QID) | RESPIRATORY_TRACT | 1 refills | Status: DC | PRN

## 2018-11-27 NOTE — Progress Notes (Signed)
Virtual Visit via Video Note  I connected with Sandra Alvarado on 11/27/18 at  9:20 AM EDT by a video enabled telemedicine application and verified that I am speaking with the correct person using two identifiers.   I discussed the limitations of evaluation and management by telemedicine. The patient expressed understanding and agreed to proceed.  History of Present Illness:  Patient presents today with chief complaint of cough. Reports that cough "comes and goes" but now is more consistent.  Cough is dry and not associated with wheezing. Reports that she took a zpak which she started on 11/13/18 without improvement. She denies associated fever, sob or post nasal drip. She reports that she has not had any recent gerd symptoms.  Denies itchy eyes and sneezing. Reports she is only bad when she coughs for a while.  Reports that she has been using robitussin which helps for a little bit.     Observations/Objective:   Gen: Awake, alert, no acute distress Resp: Breathing is even and non-labored Psych: calm/pleasant demeanor Neuro: Alert and Oriented x 3, + facial symmetry, speech is clear.   Assessment and Plan:  Cough- ? Asthma component,  ? Genella Rife.  Will give trial of omeprazole, prn albuterol.  Pt is advised to call if new/worsening symptoms or if no improvement in 3-4 days. Would plan to send for cxr at that time.  Trying to limit outings due to covid-19 pandemic if not absolutely necessary. Pt verbalizes understanding.   Follow Up Instructions:    I discussed the assessment and treatment plan with the patient. The patient was provided an opportunity to ask questions and all were answered. The patient agreed with the plan and demonstrated an understanding of the instructions.   The patient was advised to call back or seek an in-person evaluation if the symptoms worsen or if the condition fails to improve as anticipated.   Lemont Fillers, NP

## 2018-11-27 NOTE — Telephone Encounter (Signed)
Spoke with pt and scheduled Webex for today at 9:30 with Melissa.
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
## 2019-02-01 ENCOUNTER — Ambulatory Visit: Payer: Self-pay

## 2019-02-01 NOTE — Telephone Encounter (Signed)
Patient called and says she was indirectly exposed to a co-worker who was tested positive for covid. She says the co-worker works in the same office space and she passes by the co-worker and they all use the same common areas. She says this was early last week when the co-worker was last at work. She says she's been having a sore throat and headache for the past 2 days, no SOB, no fever. The call was dropped as I placed her on hold to call the office. I attempted several times to call her back, no answer and the voice mail picked up. I left a message to return the call to me directly. I will route to the office for resolution.  Answer Assessment - Initial Assessment Questions 1. CLOSE CONTACT: "Who is the person with the confirmed or suspected COVID-19 infection that you were exposed to?"     Co-worker who do not have direct contact 2. PLACE of CONTACT: "Where were you when you were exposed to COVID-19?" (e.g., home, school, medical waiting room; which city?)     Same office space 3. TYPE of CONTACT: "How much contact was there?" (e.g., sitting next to, live in same house, work in same office, same building)     Passing by and using the same common areas at work 4. DURATION of CONTACT: "How long were you in contact with the COVID-19 patient?" (e.g., a few seconds, passed by person, a few minutes, live with the patient)     8 hours day 5 days week 5. DATE of CONTACT: "When did you have contact with a COVID-19 patient?" (e.g., how many days ago)     Probably early last week 6. TRAVEL: "Have you traveled out of the country recently?" If so, "When and where?"     * Also ask about out-of-state travel, since the CDC has identified some high-risk cities for community spread in the [place].     * Note: Travel becomes less relevant if there is widespread community transmission where the patient lives.     No 7. COMMUNITY SPREAD: "Are there lots of cases of COVID-19 (community spread) where you live?" (See public  health department website, if unsure)       No 8. SYMPTOMS: "Do you have any symptoms?" (e.g., fever, cough, breathing difficulty)     Headache and sore throat for past 2 days 9. PREGNANCY OR POSTPARTUM: "Is there any chance you are pregnant?" "When was your last menstrual period?" "Did you deliver in the last 2 weeks?"     No 10. HIGH RISK: "Do you have any heart or lung problems? Do you have a weak immune system?" (e.g., CHF, COPD, asthma, HIV positive, chemotherapy, renal failure, diabetes mellitus, sickle cell anemia)      No  Protocols used: CORONAVIRUS (COVID-19) EXPOSURE-A-AH

## 2019-02-02 ENCOUNTER — Telehealth: Payer: Self-pay | Admitting: Family

## 2019-02-02 ENCOUNTER — Encounter: Payer: Self-pay | Admitting: Family

## 2019-02-02 ENCOUNTER — Telehealth: Payer: Self-pay | Admitting: *Deleted

## 2019-02-02 ENCOUNTER — Ambulatory Visit (INDEPENDENT_AMBULATORY_CARE_PROVIDER_SITE_OTHER): Payer: BC Managed Care – PPO | Admitting: Family

## 2019-02-02 DIAGNOSIS — Z20822 Contact with and (suspected) exposure to covid-19: Secondary | ICD-10-CM

## 2019-02-02 DIAGNOSIS — Z20828 Contact with and (suspected) exposure to other viral communicable diseases: Secondary | ICD-10-CM | POA: Diagnosis not present

## 2019-02-02 NOTE — Telephone Encounter (Signed)
Please schedule pt for virtual OV today.

## 2019-02-02 NOTE — Telephone Encounter (Signed)
33 yr old female with sore throat,headache, covid-19 exposure at work. Needs COVID019 testing please.

## 2019-02-02 NOTE — Addendum Note (Signed)
Addended by: Dimple Nanas on: 02/02/2019 07:06 PM   Modules accepted: Orders

## 2019-02-02 NOTE — Progress Notes (Signed)
Virtual Visit via Video Note  I connected with@ on 02/02/19 at  9:40 AM EDT by a video enabled telemedicine application and verified that I am speaking with the correct person using two identifiers. This visit type was conducted due to national recommendations for restrictions regarding the COVID-19 Pandemic (e.g. social distancing).  This format is felt to be most appropriate for this patient at this time.   I discussed the limitations of evaluation and management by telemedicine and the availability of in person appointments. The patient expressed understanding and agreed to proceed.  Only the patient and myself were on today's video visit. The patient was at home and I was in my office at the time of today's visit.   History of Present Illness:  Patient is a 33 yr old female who presents today with report of possible covid exposure.   Reports coworker tested positive in her department. Reports HA since the beginning of the week. No fever. + sore throat, was worse on Monday or Tuesday.  No "pockets or anything on my tonsils." Reports that she rarely gets headaches.Denies myalgia.     Past Medical History:  Diagnosis Date  . Abnormal Pap smear 09/2012   Colposcopy = CIN-I  . Acid reflux   . ASCUS (atypical squamous cells of undetermined significance) on Pap smear 2008  . BV (bacterial vaginosis) 10/02/07  . Condyloma 2010  . GBS carrier   . H/O candidiasis   . H/O nausea 10/2006  . H/O varicella   . 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 History of chicken pox   . History of herpes simplex infection   . Irregular periods/menstrual cycles      Social History   Socioeconomic History  . Marital status: Single    Spouse name: Not on file  . Number of children: Not on file  . Years of education: Not on file  . Highest education level: Not on file  Occupational History  . Not on file  Social Needs  . Financial resource strain: Not on file  . Food insecurity    Worry: Not on file   Inability: Not on file  . Transportation needs    Medical: Not on file    Non-medical: Not on file  Tobacco Use  . Smoking status: Former Smoker    Types: Cigarettes    Quit date: 01/13/2015    Years since quitting: 4.0  . Smokeless tobacco: Never Used  Substance and Sexual Activity  . Alcohol use: Yes    Comment: OCCASIONAL  . Drug use: No  . Sexual activity: Yes    Birth control/protection: None  Lifestyle  . Physical activity    Days per week: Not on file    Minutes per session: Not on file  . Stress: Not on file  Relationships  . Social Herbalist on phone: Not on file    Gets together: Not on file    Attends religious service: Not on file    Active member of club or organization: Not on file    Attends meetings of clubs or organizations: Not on file    Relationship status: Not on file  . Intimate partner violence    Fear of current or ex partner: Not on file    Emotionally abused: Not on file    Physically abused: Not on file    Forced sexual activity: Not on file  Other Topics Concern  . Not on file  Social History Narrative  Lives with her daughter (2008) and her husband.     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 COLPOSCOPY  01/17/09  . DILATION AND EVACUATION N/A 03/21/2015   Procedure: DILATATION AND EVACUATION Ultrasound Guided;  Surgeon: Jaymes GraffNaima Dillard, MD;  Location: [facility];  Service: Gynecology;  Laterality: N/A;  . WISDOM TOOTH EXTRACTION      Family History  Problem Relation Age of Onset  . Hypertension Maternal Grandmother   . Thyroid disease Maternal Grandmother   . Arthritis Maternal Grandmother   . Depression Maternal Grandmother   . Hyperlipidemia Paternal Grandmother   .  Hypertension Father   . Hyperlipidemia Father   . Thyroid disease Mother   . Diabetes Mother        type II  . Alcohol abuse Maternal Grandfather   . Alcohol abuse Paternal Grandfather     No Known Allergies  Current Outpatient Medications on File Prior to Visit  Medication Sig Dispense Refill  . Albuterol Sulfate 108 (90 Base) MCG/ACT AEPB Inhale 2 puffs into the lungs every 6 (six) hours as needed. 1 each 1  . azithromycin (ZITHROMAX) 250 MG tablet Use as a zpack 6 tablet 0  . benzonatate (TESSALON) 100 MG capsule Take 1 capsule (100 mg total) by mouth 3 (three) times daily as needed for cough. 40 capsule 0  . omeprazole (PRILOSEC) 40 MG capsule Take 1 capsule (40 mg total) by mouth daily. 30 capsule 3   No current facility-administered medications on file prior to visit.     There were no vitals taken for this visit.   Observations/Objective:    Gen: Awake, alert, no acute distress Resp: Breathing is even and non-labored Psych: calm/pleasant demeanor Neuro: Alert and Oriented x 3, + facial symmetry, speech is clear.  Assessment and Plan:  COVID-19 exposure- Due to exposure and report of sore throat will arrange COVID-19 testing. She is advised to self quarantine until results are back and hopefully negative.  Pt is advised on supportive measures in the meantime including rest, hydration, tylenol as needed. She is advised to go to the ER if severe/worsening symptoms such as shortness of breath occur.  Follow Up Instructions:    I discussed the assessment and treatment plan with the patient. The patient was provided an opportunity to ask questions and all were answered. The patient agreed with the plan and demonstrated an understanding of the instructions.   The patient was advised to call back or seek an in-person evaluation if the symptoms worsen or if the condition fails to improve as anticipated.    Lemont FillersMelissa S O'Sullivan, NP

## 2019-02-02 NOTE — Telephone Encounter (Signed)
Pt scheduled for testing on 02/05/19 at [facility] site.  

## 2019-02-02 NOTE — Telephone Encounter (Signed)
Left VM for pt to CB regarding covid 19 scheduling. Requested by Debbrah Alar, NP  Pt with sore throat,headache, covid-19 exposure at work.   Pts CB# [phone]

## 2019-02-02 NOTE — Telephone Encounter (Signed)
Patient called and scheduled for testing at [facility] site on 02/05/19 at 9:15 am. Pt advised to wear a mask and remain in car at the time of appt. Understanding verbalized.

## 2019-02-05 ENCOUNTER — Encounter: Payer: Self-pay | Admitting: Family

## 2019-02-05 ENCOUNTER — Other Ambulatory Visit: Payer: BC Managed Care – PPO

## 2019-02-05 DIAGNOSIS — Z20822 Contact with and (suspected) exposure to covid-19: Secondary | ICD-10-CM

## 2019-02-05 DIAGNOSIS — R6889 Other general symptoms and signs: Secondary | ICD-10-CM | POA: Diagnosis not present

## 2019-02-07 LAB — NOVEL CORONAVIRUS, NAA: SARS-CoV-2, NAA: NOT DETECTED

## 2019-02-20 DIAGNOSIS — N898 Other specified noninflammatory disorders of vagina: Secondary | ICD-10-CM | POA: Diagnosis not present

## 2019-02-20 DIAGNOSIS — R110 Nausea: Secondary | ICD-10-CM | POA: Diagnosis not present

## 2019-02-20 DIAGNOSIS — R140 Abdominal distension (gaseous): Secondary | ICD-10-CM | POA: Diagnosis not present

## 2019-02-20 DIAGNOSIS — Z113 Encounter for screening for infections with a predominantly sexual mode of transmission: Secondary | ICD-10-CM | POA: Diagnosis not present

## 2019-02-20 DIAGNOSIS — Z9189 Other specified personal risk factors, not elsewhere classified: Secondary | ICD-10-CM | POA: Diagnosis not present

## 2019-03-26 DIAGNOSIS — R102 Pelvic and perineal pain: Secondary | ICD-10-CM | POA: Diagnosis not present

## 2019-03-26 DIAGNOSIS — N760 Acute vaginitis: Secondary | ICD-10-CM | POA: Diagnosis not present

## 2019-04-05 ENCOUNTER — Other Ambulatory Visit: Payer: Self-pay

## 2019-04-05 ENCOUNTER — Ambulatory Visit (INDEPENDENT_AMBULATORY_CARE_PROVIDER_SITE_OTHER): Payer: BC Managed Care – PPO | Admitting: Internal Medicine

## 2019-04-05 ENCOUNTER — Telehealth: Payer: Self-pay | Admitting: Family

## 2019-04-05 DIAGNOSIS — R05 Cough: Secondary | ICD-10-CM | POA: Diagnosis not present

## 2019-04-05 DIAGNOSIS — R51 Headache: Secondary | ICD-10-CM | POA: Diagnosis not present

## 2019-04-05 DIAGNOSIS — Z20822 Contact with and (suspected) exposure to covid-19: Secondary | ICD-10-CM

## 2019-04-05 DIAGNOSIS — Z20828 Contact with and (suspected) exposure to other viral communicable diseases: Secondary | ICD-10-CM

## 2019-04-05 NOTE — Telephone Encounter (Signed)
Relation to pt: self  Call back number: [phone]   Reason for call:  Patient checking on the status of Dr. Note stating Dr. Larose Kells recommends patient to be tested for COVID due to the following symtopms noted at today Office Visit. Please advise when letter has been uploaded to My Chart

## 2019-04-05 NOTE — Progress Notes (Signed)
Subjective:    Patient ID: Sandra Alvarado, female    DOB: 11/01/1985, 33 y.o.   MRN: [id number]  DOS:  04/05/2019 Type of visit - description: Virtual Visit via Video Note  I connected with@   by a video enabled telemedicine application and verified that I am speaking with the correct person using two identifiers.   THIS ENCOUNTER IS A VIRTUAL VISIT DUE TO COVID-19 - PATIENT WAS NOT SEEN IN THE OFFICE. PATIENT HAS CONSENTED TO VIRTUAL VISIT / TELEMEDICINE VISIT   Location of patient: home  Location of provider: office  I discussed the limitations of evaluation and management by telemedicine and the availability of in person appointments. The patient expressed understanding and agreed to proceed.  History of Present Illness: Acute Patient  spent sometime with her cousin 4 and  5 days ago.  She just learned that she came back COVID-19 positive. [facility] a mild headache, sore throat, some cough. She is concerned.  Would like to be tested for Covid  Review of Systems Denies fevers or chills No myalgias No nausea, vomiting, diarrhea No sputum production or wheezing No lack of smell or taste.     Past Medical History:  Diagnosis Date  . Abnormal Pap smear 09/2012   Colposcopy = CIN-I  . Acid reflux   . ASCUS (atypical squamous cells of undetermined significance) on Pap smear 2008  . BV (bacterial vaginosis) 10/02/07  . Condyloma 2010  . GBS carrier   . H/O candidiasis   . H/O nausea 10/2006  . H/O varicella   . 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 History of chicken pox   . History of herpes simplex infection   . Irregular periods/menstrual cycles     Past Surgical History:  Procedure Laterality Date  . COLPOSCOPY  01/17/09  . DILATION AND EVACUATION N/A 03/21/2015   Procedure: DILATATION AND EVACUATION Ultrasound Guided;  Surgeon: Jaymes GraffNaima Dillard, MD;  Location: [facility];  Service: Gynecology;  Laterality: N/A;  . WISDOM TOOTH EXTRACTION       Social History   Socioeconomic History  . Marital status: Single    Spouse name: Not on file  . Number of children: Not on file  . Years of education: Not on file  . Highest education level: Not on file  Occupational History  . Not on file  Social Needs  . Financial resource strain: Not on file  . Food insecurity    Worry: Not on file    Inability: Not on file  . Transportation needs    Medical: Not on file    Non-medical: Not on file  Tobacco Use  . Smoking status: Former Smoker    Types: Cigarettes    Quit date: 01/13/2015    Years since quitting: 4.2  . Smokeless tobacco: Never Used  Substance and Sexual Activity  . Alcohol use: Yes    Comment: OCCASIONAL  . Drug use: No  . Sexual activity: Yes    Birth control/protection: None  Lifestyle  . Physical activity    Days per week: Not on file    Minutes per session: Not on file  . Stress: Not on file  Relationships  . Social Musicianconnections    Talks on phone: Not on file    Gets together: Not on file    Attends religious service: Not on file    Active member of club or organization: Not on file    Attends meetings of clubs  or organizations: Not on file    Relationship status: Not on file  . Intimate partner violence    Fear of current or ex partner: Not on file    Emotionally abused: Not on file    Physically abused: Not on file    Forced sexual activity: Not on file  Other Topics Concern  . Not on file  Social History Narrative   Lives with her daughter (2008) and her husband.     Works as a Systems developer at credit [employer]   Completed associates degree, some bachelors    Enjoys sleeping/reading, movies, eat   Spending time with daughter   [name]   Born in [place], raised in [place], moved here for 11 years      Allergies as of 04/05/2019   No Known Allergies     Medication List       Accurate as of April 05, 2019 10:52 AM. If you have any questions, ask your nurse or doctor.         Albuterol Sulfate 108 (90 Base) MCG/ACT Aepb Inhale 2 puffs into the lungs every 6 (six) hours as needed.   azithromycin 250 MG tablet Commonly known as: ZITHROMAX Use as a zpack   benzonatate 100 MG capsule Commonly known as: TESSALON Take 1 capsule (100 mg total) by mouth 3 (three) times daily as needed for cough.   omeprazole 40 MG capsule Commonly known as: PRILOSEC Take 1 capsule (40 mg total) by mouth daily.           Objective:   Physical Exam There were no vitals taken for this visit. This is a virtual video visit, alert oriented x3, no apparent distress    Assessment    COVID-19 exposure: As described above, patient has mild symptoms. She lives at home with her daughter and husband. Plan: Rest, fluids, Robitussin-DM, use albuterol inhaler that she has ( leftover from few months ago) if severe cough or wheezing. Check for COVID-19 Continue precautions even at home until test come back. In the future needs to remain very vigilant even when she meets family, a safe alternative would be to stay outdoors and using a mask. Indications  for ER visit discussed.    I discussed the assessment and treatment plan with the patient. The patient was provided an opportunity to ask questions and all were answered. The patient agreed with the plan and demonstrated an understanding of the instructions.   The patient was advised to call back or seek an in-person evaluation if the symptoms worsen or if the condition fails to improve as anticipated.

## 2019-04-06 ENCOUNTER — Other Ambulatory Visit: Payer: Self-pay

## 2019-04-06 DIAGNOSIS — Z20822 Contact with and (suspected) exposure to covid-19: Secondary | ICD-10-CM

## 2019-04-06 NOTE — Telephone Encounter (Signed)
Uploaded note for patient on mychart.

## 2019-04-06 NOTE — Telephone Encounter (Signed)
Patient called in stating she is still waiting for the letter and is able to be uploaded on mychart. Patient's employer is needing it ASAP. Call back [phone] when done.

## 2019-04-06 NOTE — Telephone Encounter (Signed)
Please upload a  note for this patient  To whom it may concern,  I saw Sandra Alvarado 04/05/2019, at the time we suspected she was in contact with a COVID-19, she had some headaches, sore throat and mild cough. I sent her to be tested and results are so far are pending.

## 2019-04-08 LAB — NOVEL CORONAVIRUS, NAA: SARS-CoV-2, NAA: NOT DETECTED

## 2019-05-11 ENCOUNTER — Other Ambulatory Visit: Payer: Self-pay

## 2019-05-11 ENCOUNTER — Ambulatory Visit (INDEPENDENT_AMBULATORY_CARE_PROVIDER_SITE_OTHER): Payer: BC Managed Care – PPO | Admitting: Family

## 2019-05-11 ENCOUNTER — Encounter: Payer: Self-pay | Admitting: Family

## 2019-05-11 VITALS — BP 121/61 | HR 62 | Temp 97.5°F | Resp 16 | Ht 61.0 in | Wt 181.0 lb

## 2019-05-11 DIAGNOSIS — Z0000 Encounter for general adult medical examination without abnormal findings: Secondary | ICD-10-CM | POA: Diagnosis not present

## 2019-05-11 NOTE — Progress Notes (Signed)
Subjective:    Patient ID: Sandra Alvarado, female    DOB: 11/26/1985, 33 y.o.   MRN: [id number]  HPI  Patient presents today for complete physical.  Immunizations:  Tdap 2018, declines flu shot Diet:  Fair Reports that she eats too many carbs. Drinks soda 1 can/day 5 times a week Wt Readings from Last 3 Encounters:  05/11/19 181 lb (82.1 kg)  10/05/18 180 lb (81.6 kg)  09/23/17 167 lb (75.8 kg)  Exercise: not exercising much  Pap Smear: 7/18  Vision: due Dental: due    Review of Systems  Constitutional: Positive for unexpected weight change.  HENT: Negative for hearing loss.   Eyes: Negative for visual disturbance.  Respiratory: Negative for cough and shortness of breath.   Cardiovascular: Negative for chest pain.  Gastrointestinal: Positive for constipation (mild constipation). Negative for diarrhea.  Genitourinary: Negative for dysuria, frequency, hematuria and menstrual problem.  Musculoskeletal: Negative for arthralgias and myalgias.  Skin: Negative for rash.  Neurological: Negative for headaches.  Hematological: Negative for adenopathy.  Psychiatric/Behavioral:       Denies depression/anxiety       Past Medical History:  Diagnosis Date  . Abnormal Pap smear 09/2012   Colposcopy = CIN-I  . Acid reflux   . ASCUS (atypical squamous cells of undetermined significance) on Pap smear 2008  . BV (bacterial vaginosis) 10/02/07  . Condyloma 2010  . GBS carrier   . H/O candidiasis   . H/O nausea 10/2006  . H/O varicella   . 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 History of chicken pox   . History of herpes simplex infection   . Irregular periods/menstrual cycles      Social History   Socioeconomic History  . Marital status: Single    Spouse name: Not on file  . Number of children: Not on file  . Years of education: Not on file  . Highest education level: Not on file  Occupational History  . Not on file  Social Needs  . Financial resource strain: Not on file  . Food  insecurity    Worry: Not on file    Inability: Not on file  . Transportation needs    Medical: Not on file    Non-medical: Not on file  Tobacco Use  . Smoking status: Former Smoker    Types: Cigarettes    Quit date: 01/13/2015    Years since quitting: 4.3  . Smokeless tobacco: Never Used  Substance and Sexual Activity  . Alcohol use: Yes    Comment: OCCASIONAL  . Drug use: No  . Sexual activity: Yes    Birth control/protection: None  Lifestyle  . Physical activity    Days per week: Not on file    Minutes per session: Not on file  . Stress: Not on file  Relationships  . Social Musicianconnections    Talks on phone: Not on file    Gets together: Not on file    Attends religious service: Not on file    Active member of club or organization: Not on file    Attends meetings of clubs or organizations: Not on file    Relationship status: Not on file  . Intimate partner violence    Fear of current or ex partner: Not on file    Emotionally abused: Not on file    Physically abused: Not on file    Forced sexual activity: Not on file  Other Topics Concern  . Not on file  Social History Narrative   Lives with her daughter (2008) and her husband.     Works as a Systems developer at [employer]   Completed associates degree, some bachelors    Enjoys sleeping/reading, movies, eat   Spending time with daughter   Sandra Alvarado   Born [place], raised in [place], moved here for 11 years    Past Surgical History:  Procedure Laterality Date  . COLPOSCOPY  01/17/09  . DILATION AND EVACUATION N/A 03/21/2015   Procedure: DILATATION AND EVACUATION Ultrasound Guided;  Surgeon: Crawford Givens, MD;  Location: [facility];  Service: Gynecology;  Laterality: N/A;  . WISDOM TOOTH EXTRACTION      Family History  Problem Relation Age of Onset  . Hypertension Maternal Grandmother   . Thyroid disease Maternal Grandmother   . Arthritis Maternal Grandmother   . Depression Maternal Grandmother   .  Hyperlipidemia Paternal Grandmother   . Hypertension Father   . Hyperlipidemia Father   . Thyroid disease Mother   . Diabetes Mother        type II  . Alcohol abuse Maternal Grandfather   . Alcohol abuse Paternal Grandfather     No Known Allergies  Current Outpatient Medications on File Prior to Visit  Medication Sig Dispense Refill  . Albuterol Sulfate 108 (90 Base) MCG/ACT AEPB Inhale 2 puffs into the lungs every 6 (six) hours as needed. 1 each 1   No current facility-administered medications on file prior to visit.     BP 121/61 (BP Location: Left Arm, Patient Position: Sitting, Cuff Size: Small)   Pulse 62   Temp (!) 97.5 F (36.4 C) (Temporal)   Resp 16   Ht 5\' 1"  (1.549 m)   Wt 181 lb (82.1 kg)   LMP 04/24/2019   SpO2 100%   BMI 34.20 kg/m    Objective:   Physical Exam Physical Exam  Constitutional: She is oriented to person, place, and time. She appears well-developed and well-nourished and overweight. No distress.  HENT:  Head: Normocephalic and atraumatic.  Right Ear: Tympanic membrane and ear canal normal.  Left Ear: Tympanic membrane and ear canal normal.  Mouth/Throat: Not examined- pt wearing mask for covid-19 precautions Eyes: Pupils are equal, round, and reactive to light. No scleral icterus.  Neck: Normal range of motion. No thyromegaly present.  Cardiovascular: Normal rate and regular rhythm.   No murmur heard. Pulmonary/Chest: Effort normal and breath sounds normal. No respiratory distress. He has no wheezes. She has no rales. She exhibits no tenderness.  Abdominal: Soft. Bowel sounds are normal. She exhibits no distension and no mass. There is no tenderness. There is no rebound and no guarding.  Musculoskeletal: She exhibits no edema.  Lymphadenopathy:    She has no cervical adenopathy.  Neurological: She is alert and oriented to person, place, and time. She has normal patellar reflexes. She exhibits normal muscle tone. Coordination normal.  Skin:  Skin is warm and dry.  Psychiatric: She has a normal mood and affect. Her behavior is normal. Judgment and thought content normal.  Breast/pelvic: deferred         Assessment & Plan:   Preventative care- discussed healthy diet, exercise, weight loss. Declines flu shot. Tetanus up to date. Obtain routine lab work. She has pap scheduled with GYN.     Assessment & Plan:

## 2019-05-11 NOTE — Patient Instructions (Signed)
Please complete lab work prior to leaving. Work on healthy diet, exercise, weight loss.   

## 2019-05-12 LAB — CBC WITH DIFFERENTIAL/PLATELET
Absolute Monocytes: 639 cells/uL (ref 200–950)
Basophils Absolute: 41 cells/uL (ref 0–200)
Basophils Relative: 0.4 %
Eosinophils Absolute: 31 cells/uL (ref 15–500)
Eosinophils Relative: 0.3 %
HCT: 41 % (ref 35.0–45.0)
Hemoglobin: 13.6 g/dL (ref 11.7–15.5)
Lymphs Abs: 2925 cells/uL (ref 850–3900)
MCH: 29.8 pg (ref 27.0–33.0)
MCHC: 33.2 g/dL (ref 32.0–36.0)
MCV: 89.7 fL (ref 80.0–100.0)
MPV: 12.5 fL (ref 7.5–12.5)
Monocytes Relative: 6.2 %
Neutro Abs: 6664 cells/uL (ref 1500–7800)
Neutrophils Relative %: 64.7 %
Platelets: 320 10*3/uL (ref 140–400)
RBC: 4.57 10*6/uL (ref 3.80–5.10)
RDW: 12 % (ref 11.0–15.0)
Total Lymphocyte: 28.4 %
WBC: 10.3 10*3/uL (ref 3.8–10.8)

## 2019-05-12 LAB — LIPID PANEL
Cholesterol: 167 mg/dL (ref <200)
HDL: 32 mg/dL — ABNORMAL LOW (ref >=50)
LDL Cholesterol (Calc): 114 mg/dL (calc) — ABNORMAL HIGH
Non-HDL Cholesterol (Calc): 135 mg/dL (calc) — ABNORMAL HIGH (ref <130)
Total CHOL/HDL Ratio: 5.2 (calc) — ABNORMAL HIGH (ref <5.0)
Triglycerides: 104 mg/dL (ref <150)

## 2019-05-12 LAB — HEPATIC FUNCTION PANEL
AG Ratio: 1.6 (calc) (ref 1.0–2.5)
ALT: 20 U/L (ref 6–29)
AST: 17 U/L (ref 10–30)
Albumin: 4 g/dL (ref 3.6–5.1)
Alkaline phosphatase (APISO): 63 U/L (ref 31–125)
Bilirubin, Direct: 0.1 mg/dL (ref 0.0–0.2)
Globulin: 2.5 g/dL (calc) (ref 1.9–3.7)
Indirect Bilirubin: 0.5 mg/dL (calc) (ref 0.2–1.2)
Total Bilirubin: 0.6 mg/dL (ref 0.2–1.2)
Total Protein: 6.5 g/dL (ref 6.1–8.1)

## 2019-05-12 LAB — BASIC METABOLIC PANEL
BUN: 12 mg/dL (ref 7–25)
CO2: 23 mmol/L (ref 20–32)
Calcium: 8.6 mg/dL (ref 8.6–10.2)
Chloride: 103 mmol/L (ref 98–110)
Creat: 0.72 mg/dL (ref 0.50–1.10)
Glucose, Bld: 81 mg/dL (ref 65–99)
Potassium: 4.2 mmol/L (ref 3.5–5.3)
Sodium: 137 mmol/L (ref 135–146)

## 2019-05-12 LAB — TSH: TSH: 1.55 mIU/L

## 2019-05-14 ENCOUNTER — Encounter: Payer: Self-pay | Admitting: Family

## 2019-07-26 ENCOUNTER — Other Ambulatory Visit: Payer: Self-pay

## 2019-07-26 ENCOUNTER — Encounter: Payer: Self-pay | Admitting: Family Medicine

## 2019-07-26 ENCOUNTER — Ambulatory Visit (INDEPENDENT_AMBULATORY_CARE_PROVIDER_SITE_OTHER): Payer: BC Managed Care – PPO | Admitting: Family Medicine

## 2019-07-26 DIAGNOSIS — R05 Cough: Secondary | ICD-10-CM | POA: Diagnosis not present

## 2019-07-26 DIAGNOSIS — J029 Acute pharyngitis, unspecified: Secondary | ICD-10-CM | POA: Diagnosis not present

## 2019-07-26 DIAGNOSIS — R059 Cough, unspecified: Secondary | ICD-10-CM

## 2019-07-26 MED ORDER — AMOXICILLIN 500 MG PO CAPS
1000.0000 mg | ORAL_CAPSULE | Freq: Two times a day (BID) | ORAL | 0 refills | Status: DC
Start: 2019-07-26 — End: 2019-08-06
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
## 2019-07-27 ENCOUNTER — Telehealth: Payer: Self-pay | Admitting: *Deleted

## 2019-07-27 ENCOUNTER — Other Ambulatory Visit: Payer: Self-pay

## 2019-07-27 ENCOUNTER — Encounter: Payer: Self-pay | Admitting: Family Medicine

## 2019-07-27 DIAGNOSIS — Z20822 Contact with and (suspected) exposure to covid-19: Secondary | ICD-10-CM

## 2019-07-27 NOTE — Telephone Encounter (Signed)
Mel Almond -- can you help with this?  Pt saw Dr Lorelei Pont yesterday.  Copied from [place] [id number]. Topic: General - Inquiry >> Jul 27, 2019 10:42 AM Alease Frame wrote: Reason for CRM: Patient called in needing s note for work stating that Dr Inda Castle requested pt to be tested for covid 65 . Please advise

## 2019-07-27 NOTE — Telephone Encounter (Signed)
Yes- I put in her mychart but maybe it did not go to her correctly?  Yes for sure ok

## 2019-07-27 NOTE — Telephone Encounter (Signed)
Resent letter.

## 2019-07-27 NOTE — Telephone Encounter (Signed)
Ok to write note

## 2019-07-27 NOTE — Telephone Encounter (Signed)
Pt called back stating that "It was the doctor's request/recommendation to get the covid 19 test" Needs it before 5pm

## 2019-07-29 LAB — NOVEL CORONAVIRUS, NAA: SARS-CoV-2, NAA: NOT DETECTED

## 2019-08-06 ENCOUNTER — Ambulatory Visit (INDEPENDENT_AMBULATORY_CARE_PROVIDER_SITE_OTHER): Payer: BC Managed Care – PPO | Admitting: Internal Medicine

## 2019-08-06 ENCOUNTER — Telehealth: Payer: Self-pay

## 2019-08-06 DIAGNOSIS — Z20828 Contact with and (suspected) exposure to other viral communicable diseases: Secondary | ICD-10-CM | POA: Diagnosis not present

## 2019-08-06 DIAGNOSIS — J029 Acute pharyngitis, unspecified: Secondary | ICD-10-CM | POA: Diagnosis not present

## 2019-08-06 DIAGNOSIS — R509 Fever, unspecified: Secondary | ICD-10-CM

## 2019-08-06 DIAGNOSIS — Z20822 Contact with and (suspected) exposure to covid-19: Secondary | ICD-10-CM

## 2019-08-06 NOTE — Progress Notes (Signed)
Subjective:    Patient ID: Sandra Alvarado, female    DOB: 10-Feb-1986, 33 y.o.   MRN: [id number]  DOS:  08/06/2019 Type of visit - description: Attempted  to make this a video visit, due to technical difficulties from the patient side it was not possible  thus we proceeded with a Virtual Visit via Telephone    I connected with above mentioned patient  by telephone and verified that I am speaking with the correct person using two identifiers.  THIS ENCOUNTER IS A VIRTUAL VISIT DUE TO COVID-19 - PATIENT WAS NOT SEEN IN THE OFFICE. PATIENT HAS CONSENTED TO VIRTUAL VISIT / TELEMEDICINE VISIT   Location of patient: home  Location of provider: office  I discussed the limitations, risks, security and privacy concerns of performing an evaluation and management service by telephone and the availability of in person appointments. I also discussed with the patient that there may be a patient responsible charge related to this service. The patient expressed understanding and agreed to proceed.   History of Present Illness: Acute The patient was exposed to Covid on 07/25/2019, she tested (-)  07/27/2019, since then she has been in quarantine. However she developed a sore throat on 08/03/2019, then last night a temperature 99.8 and today a temperature of 100.8. She also has lingering cough since she was seen at this office 2 weeks ago. Her job is requesting her to be tested for COVID-19 again.     Review of Systems Denies nausea, vomiting, diarrhea. No chest pain no difficulty breathing No lack of sense of smell or taste Not on birth control pills,  LMP ~3 weeks ago  Past Medical History:  Diagnosis Date  . Abnormal Pap smear 09/2012   Colposcopy = CIN-I  . Acid reflux   . ASCUS (atypical squamous cells of undetermined significance) on Pap smear 2008  . BV (bacterial vaginosis) 10/02/07  . Condyloma 2010  . GBS carrier   . H/O candidiasis   . H/O nausea 10/2006  . H/O varicella   . High risk  HPV infection 2008  . History of chicken pox   . History of herpes simplex infection   . Irregular periods/menstrual cycles     Past Surgical History:  Procedure Laterality Date  . COLPOSCOPY  01/17/09  . DILATION AND EVACUATION N/A 03/21/2015   Procedure: DILATATION AND EVACUATION Ultrasound Guided;  Surgeon: Crawford Givens, MD;  Location: [facility];  Service: Gynecology;  Laterality: N/A;  . WISDOM TOOTH EXTRACTION      Social History   Socioeconomic History  . Marital status: Single    Spouse name: Not on file  . Number of children: Not on file  . Years of education: Not on file  . Highest education level: Not on file  Occupational History  . Not on file  Tobacco Use  . Smoking status: Former Smoker    Types: Cigarettes    Quit date: 01/13/2015    Years since quitting: 4.5  . Smokeless tobacco: Never Used  Substance and Sexual Activity  . Alcohol use: Yes    Comment: OCCASIONAL  . Drug use: No  . Sexual activity: Yes    Birth control/protection: None  Other Topics Concern  . Not on file  Social History Narrative   Lives with her daughter (2008) and her husband.     Works as a Systems developer at [employer]   Completed associates degree, some bachelors    Enjoys sleeping/reading, movies, eat  Spending time with daughter   Toya Smothers   Born in [place], raised in [place], moved here for 11 years   Social Determinants of Corporate investment banker Strain:   . Difficulty of Paying Living Expenses: Not on file  Food Insecurity:   . Worried About Programme researcher, broadcasting/film/video in the Last Year: Not on file  . Ran Out of Food in the Last Year: Not on file  Transportation Needs:   . Lack of Transportation (Medical): Not on file  . Lack of Transportation (Non-Medical): Not on file  Physical Activity:   . Days of Exercise per Week: Not on file  . Minutes of Exercise per Session: Not on file  Stress:   . Feeling of Stress : Not on file  Social Connections:   . Frequency  of Communication with Friends and Family: Not on file  . Frequency of Social Gatherings with Friends and Family: Not on file  . Attends Religious Services: Not on file  . Active Member of Clubs or Organizations: Not on file  . Attends Banker Meetings: Not on file  . Marital Status: Not on file  Intimate Partner Violence:   . Fear of Current or Ex-Partner: Not on file  . Emotionally Abused: Not on file  . Physically Abused: Not on file  . Sexually Abused: Not on file      Allergies as of 08/06/2019   No Known Allergies     Medication List       Accurate as of August 06, 2019  2:38 PM. If you have any questions, ask your nurse or doctor.        Albuterol Sulfate 108 (90 Base) MCG/ACT Aepb Inhale 2 puffs into the lungs every 6 (six) hours as needed.   amoxicillin 500 MG capsule Commonly known as: AMOXIL Take 2 capsules (1,000 mg total) by mouth 2 (two) times daily.           Objective:   Physical Exam There were no vitals taken for this visit. This is a virtual phone visit, she sounded well, alert oriented x3, in no distress.    Assessment     33 y/o female, healthy, h/o bronchospasm? (has a inhaler)  Fever: 33 year old female, presents with low-grade fever. Her job is requesting her to be tested for COVID-19, I agree, instructions provided.  Will send a letter to her job letting them know that we are testing. Otherwise recommend rest, fluids, Tylenol, Mucinex DM continue with inhalers if she feels chest tightness or wheezing which she does from time to time. If test is negative, she will be able to go back to work 1-2 day after she becomes afebrile If test is positive, she will need to follow all the CDC guidelines.   I discussed the assessment and treatment plan with the patient. The patient was provided an opportunity to ask questions and all were answered. The patient agreed with the plan and demonstrated an understanding of the instructions.     The patient was advised to call back or seek an in-person evaluation if the symptoms worsen or if the condition fails to improve as anticipated.  I provided 15  minutes of non-face-to-face time during this encounter.  Willow Ora, MD

## 2019-08-06 NOTE — Telephone Encounter (Signed)
Saw Dr. Larose Kells today Copied from [place] [id number]. Topic: General - Other >> Aug 06, 2019  9:48 AM Sandra Alvarado wrote: Patient would like Alvarado callback from nurse in regards to her recently being seen and testing negative for covid and was written to go back to work today. However patient has developed sore throat and fever over the weekend and would like to know she needs to do and her job is asking for documentation again.

## 2019-08-08 ENCOUNTER — Ambulatory Visit: Payer: BC Managed Care – PPO | Admitting: Family

## 2019-08-09 ENCOUNTER — Ambulatory Visit: Payer: BC Managed Care – PPO | Attending: Internal Medicine

## 2019-08-09 DIAGNOSIS — U071 COVID-19: Secondary | ICD-10-CM

## 2019-08-09 DIAGNOSIS — R238 Other skin changes: Secondary | ICD-10-CM

## 2019-08-11 LAB — NOVEL CORONAVIRUS, NAA: SARS-CoV-2, NAA: NOT DETECTED

## 2019-08-22 DIAGNOSIS — Z6833 Body mass index (BMI) 33.0-33.9, adult: Secondary | ICD-10-CM | POA: Diagnosis not present

## 2019-08-22 DIAGNOSIS — Z01419 Encounter for gynecological examination (general) (routine) without abnormal findings: Secondary | ICD-10-CM | POA: Diagnosis not present

## 2019-08-22 DIAGNOSIS — Z113 Encounter for screening for infections with a predominantly sexual mode of transmission: Secondary | ICD-10-CM | POA: Diagnosis not present

## 2019-09-06 ENCOUNTER — Encounter: Payer: Self-pay | Admitting: Medical

## 2019-09-06 ENCOUNTER — Ambulatory Visit (INDEPENDENT_AMBULATORY_CARE_PROVIDER_SITE_OTHER): Payer: BC Managed Care – PPO | Admitting: Medical

## 2019-09-06 ENCOUNTER — Telehealth: Payer: Self-pay | Admitting: Medical

## 2019-09-06 ENCOUNTER — Other Ambulatory Visit: Payer: Self-pay

## 2019-09-06 VITALS — Temp 98.2°F | Ht 61.0 in | Wt 180.0 lb

## 2019-09-06 DIAGNOSIS — J40 Bronchitis, not specified as acute or chronic: Secondary | ICD-10-CM

## 2019-09-06 DIAGNOSIS — Z20822 Contact with and (suspected) exposure to covid-19: Secondary | ICD-10-CM

## 2019-09-06 DIAGNOSIS — R059 Cough, unspecified: Secondary | ICD-10-CM

## 2019-09-06 DIAGNOSIS — R062 Wheezing: Secondary | ICD-10-CM | POA: Diagnosis not present

## 2019-09-06 DIAGNOSIS — R05 Cough: Secondary | ICD-10-CM

## 2019-09-06 MED ORDER — ALBUTEROL SULFATE HFA 108 (90 BASE) MCG/ACT IN AERS: 2.0000 | INHALATION_SPRAY | Freq: Four times a day (QID) | RESPIRATORY_TRACT | 0 refills | Status: DC | PRN

## 2019-09-06 MED ORDER — AZITHROMYCIN 250 MG PO TABS: ORAL_TABLET | ORAL | 0 refills | Status: DC

## 2019-09-06 MED ORDER — BENZONATATE 100 MG PO CAPS: 100.0000 mg | ORAL_CAPSULE | Freq: Three times a day (TID) | ORAL | 0 refills | Status: DC | PRN

## 2019-09-06 NOTE — Telephone Encounter (Signed)
Copied from CRM [id number]. Topic: General - Other >> Sep 06, 2019 11:36 AM Sandra Alvarado E wrote: Reason for CRM: Pt received a letter from the office today and stated she needs the letter to also say that the covid test was recommended by the DR. / please advise

## 2019-09-06 NOTE — Progress Notes (Signed)
Subjective:    Patient ID: Sandra Alvarado, female    DOB: Aug 05, 1986, 34 y.o.   MRN: [id number]  HPI  Virtual Visit via Telephone Note  I connected with Sandra Alvarado on 09/06/19 at 10:40 AM EST by telephone and verified that I am speaking with the correct person using two identifiers.  Location: Patient: home Provider: office   I discussed the limitations, risks, security and privacy concerns of performing an evaluation and management service by telephone and the availability of in person appointments. I also discussed with the patient that there may be a patient responsible charge related to this service. The patient expressed understanding and agreed to proceed.   History of Present Illness: Pt stats coworker who she had some contact that recently test + for covid.  Pt states Friday and Saturday she was that persons office.  Pt has recent cough with some minimal wheezing.  In addition has some mild fatigue and nasal congestion as well as mild headache.  Pt is bringing up some clear whitish phlem. Pt does have ha that started yesterday afternoon.  Pt has used albuterol twice in past 24  Hours.      Observations/Objective: General-no acute distress, pleasant, oriented.coughs intermittently. Lungs- on inspection lungs appear unlabored. Neck- no tracheal deviation or jvd on inspection. Neuro- gross motor function appears intact.    Assessment and Plan: You have some symptoms of possible bronchitis with wheezing.  Exposure to Covid as the symptoms are started.  We will go ahead and prescribe a azithromycin antibiotic, benzonatate cough medication and refill your albuterol.  Do recommend that you get O2 sat monitor in the event of positive Covid test.  Counseled patient on how to check O2 sats and counseled on what would be for her O2 sat level.  Asked her to update [place] on Covid test results when she gets those.  Also give [place] update on her symptoms at that time.  She is already  scheduled to get Covid test tomorrow.  Work excuse note sent to my chart.  Follow-up in 7 to 10 days or as needed.  Follow Up Instructions:    I discussed the assessment and treatment plan with the patient. The patient was provided an opportunity to ask questions and all were answered. The patient agreed with the plan and demonstrated an understanding of the instructions.   The patient was advised to call back or seek an in-person evaluation if the symptoms worsen or if the condition fails to improve as anticipated.  I provided 20 + minutes of non-face-to-face time during this encounter.   Esperanza Richters, PA-C    Review of Systems  Constitutional: Positive for fatigue. Negative for chills and fever.       Some fatigue but cough kept her up at night.  HENT: Positive for congestion. Negative for ear pain, sinus pressure, sinus pain and sore throat.        Normal sense of smell and taste is normal.  Respiratory: Positive for wheezing. Negative for cough, chest tightness and shortness of breath.        Minimal.  Cardiovascular: Negative for chest pain and palpitations.  Gastrointestinal: Negative for abdominal pain.  Musculoskeletal: Negative for back pain and myalgias.  Neurological: Positive for headaches. Negative for dizziness and weakness.  Hematological: Negative for adenopathy. Does not bruise/bleed easily.  Psychiatric/Behavioral: Negative for behavioral problems.       Objective:   Physical Exam        Assessment &  Plan:

## 2019-09-06 NOTE — Patient Instructions (Addendum)
You have some symptoms of possible bronchitis with wheezing.  Exposure to Covid as the symptoms are started.  We will go ahead and prescribe a azithromycin antibiotic, benzonatate cough medication and refill your albuterol.  Do recommend that you get O2 sat monitor in the event of positive Covid test.  Counseled patient on how to check O2 sats and counseled on what would be for her O2 sat level.  Asked her to update [place] on Covid test results when she gets those.  Also give [place] update on her symptoms at that time.  She is already scheduled to get Covid test tomorrow.  Work excuse note sent to my chart.  Follow-up in 7 to 10 days or as needed.

## 2019-09-06 NOTE — Progress Notes (Signed)
Letter for patient corrected to reflect provider recommendations

## 2019-09-06 NOTE — Telephone Encounter (Signed)
Letter fixed to state per provider recommendations

## 2019-09-07 ENCOUNTER — Ambulatory Visit: Payer: BC Managed Care – PPO | Attending: Internal Medicine

## 2019-09-07 DIAGNOSIS — Z20822 Contact with and (suspected) exposure to covid-19: Secondary | ICD-10-CM | POA: Diagnosis not present

## 2019-09-08 LAB — NOVEL CORONAVIRUS, NAA: SARS-CoV-2, NAA: NOT DETECTED

## 2019-09-12 ENCOUNTER — Telehealth: Payer: Self-pay

## 2019-09-12 NOTE — Telephone Encounter (Signed)
Patient called  To let her know that her Covid-19 Test came back negative. Patient would like a follow up call please and thank you.

## 2019-09-13 NOTE — Telephone Encounter (Signed)
Lvm letting patient know she can call us back at [phone]

## 2019-09-23 ENCOUNTER — Other Ambulatory Visit: Payer: Self-pay | Admitting: Medical

## 2019-11-06 ENCOUNTER — Encounter: Payer: Self-pay | Admitting: Family

## 2019-11-06 ENCOUNTER — Ambulatory Visit (INDEPENDENT_AMBULATORY_CARE_PROVIDER_SITE_OTHER): Payer: BC Managed Care – PPO | Admitting: Family

## 2019-11-06 VITALS — Temp 99.1°F

## 2019-11-06 DIAGNOSIS — B349 Viral infection, unspecified: Secondary | ICD-10-CM | POA: Diagnosis not present

## 2019-11-06 NOTE — Progress Notes (Signed)
Virtual Visit via Video Note  I connected with Sandra Alvarado on 11/06/19 at  1:40 PM EDT by a video enabled telemedicine application and verified that I am speaking with the correct person using two identifiers.  Location: Patient: home Provider: work   I discussed the limitations of evaluation and management by telemedicine and the availability of in person appointments. The patient expressed understanding and agreed to proceed.  History of Present Illness:  Patient reports that she started feeling poorly over the weekend.  She reports + HA late last week, went away and then came back Saturday.  Has associated nausea.  Reports HA is mainly on the right side of her head. Denies any vision changes.   She denies loss of taste or smell. She does report some body aches which started last night.  Denies fever.  Had a sore throat Sunday-Monday.     Observations/Objective:   Gen: Awake, alert, no acute distress- but appears tired Resp: Breathing is even and non-labored Psych: calm/pleasant demeanor Neuro: Alert and Oriented x 3, + facial symmetry, speech is clear.   Assessment and Plan:  Viral illness- I have recommended that she be tested for covid-19.  She already has an appointment scheduled for 11 AM tomorrow at [facility] for testing.  In the meantime we discussed need to quarantine pending results review and further recommendations. We also discussed supportive measures and red flags that should prompt ED visit such as SOB.  Pt verbalizes understanding.   Follow Up Instructions:    I discussed the assessment and treatment plan with the patient. The patient was provided an opportunity to ask questions and all were answered. The patient agreed with the plan and demonstrated an understanding of the instructions.   The patient was advised to call back or seek an in-person evaluation if the symptoms worsen or if the condition fails to improve as anticipated.  Lemont Fillers, NP

## 2019-11-07 ENCOUNTER — Ambulatory Visit: Payer: BC Managed Care – PPO | Attending: Internal Medicine

## 2019-11-07 DIAGNOSIS — Z20822 Contact with and (suspected) exposure to covid-19: Secondary | ICD-10-CM

## 2019-11-08 LAB — NOVEL CORONAVIRUS, NAA: SARS-CoV-2, NAA: NOT DETECTED

## 2019-12-20 DIAGNOSIS — N898 Other specified noninflammatory disorders of vagina: Secondary | ICD-10-CM | POA: Diagnosis not present

## 2019-12-20 DIAGNOSIS — R87619 Unspecified abnormal cytological findings in specimens from cervix uteri: Secondary | ICD-10-CM | POA: Diagnosis not present

## 2020-02-04 DIAGNOSIS — E669 Obesity, unspecified: Secondary | ICD-10-CM | POA: Diagnosis not present

## 2020-02-04 DIAGNOSIS — R635 Abnormal weight gain: Secondary | ICD-10-CM | POA: Diagnosis not present

## 2020-03-17 DIAGNOSIS — M545 Low back pain: Secondary | ICD-10-CM | POA: Diagnosis not present

## 2020-03-17 DIAGNOSIS — Z113 Encounter for screening for infections with a predominantly sexual mode of transmission: Secondary | ICD-10-CM | POA: Diagnosis not present

## 2020-03-17 DIAGNOSIS — N921 Excessive and frequent menstruation with irregular cycle: Secondary | ICD-10-CM | POA: Diagnosis not present

## 2020-03-17 DIAGNOSIS — R52 Pain, unspecified: Secondary | ICD-10-CM | POA: Diagnosis not present

## 2020-03-17 DIAGNOSIS — N939 Abnormal uterine and vaginal bleeding, unspecified: Secondary | ICD-10-CM | POA: Diagnosis not present

## 2020-03-20 DIAGNOSIS — E669 Obesity, unspecified: Secondary | ICD-10-CM | POA: Diagnosis not present

## 2020-04-02 DIAGNOSIS — Z9189 Other specified personal risk factors, not elsewhere classified: Secondary | ICD-10-CM | POA: Diagnosis not present

## 2020-04-21 DIAGNOSIS — E669 Obesity, unspecified: Secondary | ICD-10-CM | POA: Diagnosis not present

## 2020-05-13 DIAGNOSIS — R87610 Atypical squamous cells of undetermined significance on cytologic smear of cervix (ASC-US): Secondary | ICD-10-CM | POA: Diagnosis not present

## 2020-05-13 DIAGNOSIS — Z113 Encounter for screening for infections with a predominantly sexual mode of transmission: Secondary | ICD-10-CM | POA: Diagnosis not present

## 2020-05-13 DIAGNOSIS — R221 Localized swelling, mass and lump, neck: Secondary | ICD-10-CM | POA: Diagnosis not present

## 2020-05-13 DIAGNOSIS — Z01419 Encounter for gynecological examination (general) (routine) without abnormal findings: Secondary | ICD-10-CM | POA: Diagnosis not present

## 2020-05-13 DIAGNOSIS — Z124 Encounter for screening for malignant neoplasm of cervix: Secondary | ICD-10-CM | POA: Diagnosis not present

## 2020-05-27 DIAGNOSIS — E669 Obesity, unspecified: Secondary | ICD-10-CM | POA: Diagnosis not present

## 2020-06-09 ENCOUNTER — Telehealth: Payer: Self-pay | Admitting: Family

## 2020-06-09 NOTE — Telephone Encounter (Signed)
Patient was scheduled for a virtual visit 10-19

## 2020-06-09 NOTE — Telephone Encounter (Signed)
Caller name: Quisha Call back number:[phone]  FYI  Patient states she tested Covid + on Saturday.

## 2020-06-10 ENCOUNTER — Telehealth (INDEPENDENT_AMBULATORY_CARE_PROVIDER_SITE_OTHER): Payer: HRSA Program | Admitting: Family

## 2020-06-10 ENCOUNTER — Other Ambulatory Visit: Payer: Self-pay

## 2020-06-10 ENCOUNTER — Encounter: Payer: Self-pay | Admitting: Family

## 2020-06-10 DIAGNOSIS — U071 COVID-19: Secondary | ICD-10-CM | POA: Diagnosis not present

## 2020-06-10 MED ORDER — BENZONATATE 100 MG PO CAPS: 100.0000 mg | ORAL_CAPSULE | Freq: Two times a day (BID) | ORAL | 0 refills | Status: DC | PRN

## 2020-06-10 MED ORDER — BUDESONIDE-FORMOTEROL FUMARATE 160-4.5 MCG/ACT IN AERO: 2.0000 | INHALATION_SPRAY | Freq: Two times a day (BID) | RESPIRATORY_TRACT | 0 refills | Status: DC

## 2020-06-10 NOTE — Progress Notes (Signed)
Virtual Visit via Video Note  I connected with Sandra Alvarado on 06/10/20 at  6:00 PM EDT by a video enabled telemedicine application and verified that I am speaking with the correct person using two identifiers.  Location: Patient: home  Provider: work   I discussed the limitations of evaluation and management by telemedicine and the availability of in person appointments. The patient expressed understanding and agreed to proceed. Only the patient and myself were present for today's video call.   History of Present Illness:  Patient is a 34 yr old female who presents today to discuss positive covid-19 test. She tested positive on 06/07/20. She was evaluated in the [facility]. Reports that she first developed symptoms on  the 11th or 12th last week.  Initial symptoms include sneezing, nasal congestion, headache, body aches, fatigue.  She reports that her congestion is resolved. Has a persistent headache that waxes and wanes.  (worse at night) HA is dull.  Has intermittently cough (more frequent than her baseline cough. Reports loss of taste and and smell.   She is unsure where she was exposed.  Her husband and daughter are both negative.  She was not vaccinated.       Observations/Objective:   Gen: Awake, alert, no acute distress Resp: Breathing is even and non-labored Psych: calm/pleasant demeanor Neuro: Alert and Oriented x 3, + facial symmetry, speech is clear.   Assessment and Plan:  Covid-19 infection- clinically improving.  She is nearing the end of the time frame for monoclonal antibody infusion and she is improving therefore will not pursue infusion.  Advised of CDC guidelines for self isolation/ ending isolation.  Advised of safe practice guidelines. Symptom Tier reviewed. Encouraged to monitor for any worsening symptoms; watch for increased shortness of breath, weakness, and signs of dehydration. Advised when to seek emergency care.  Instructed to rest and hydrate well.   Advised to leave the house during recommended isolation period, only if it is necessary to seek medical care. Also encouraged pt to get vaccinated against COVID-19.    20 minutes spent on today's visit.    Follow Up Instructions:    I discussed the assessment and treatment plan with the patient. The patient was provided an opportunity to ask questions and all were answered. The patient agreed with the plan and demonstrated an understanding of the instructions.   The patient was advised to call back or seek an in-person evaluation if the symptoms worsen or if the condition fails to improve as anticipated.  Lemont Fillers, NP

## 2020-07-04 DIAGNOSIS — E669 Obesity, unspecified: Secondary | ICD-10-CM | POA: Diagnosis not present

## 2020-07-18 DIAGNOSIS — Z20822 Contact with and (suspected) exposure to covid-19: Secondary | ICD-10-CM | POA: Diagnosis not present

## 2021-01-01 DIAGNOSIS — Z20822 Contact with and (suspected) exposure to covid-19: Secondary | ICD-10-CM | POA: Diagnosis not present

## 2021-01-12 ENCOUNTER — Telehealth: Payer: Self-pay | Admitting: Family

## 2021-01-12 NOTE — Telephone Encounter (Signed)
Patient would like lab draw before her cpe appt on 01/21/21 at 0540pm

## 2021-01-13 ENCOUNTER — Other Ambulatory Visit: Payer: Self-pay

## 2021-01-13 ENCOUNTER — Encounter: Payer: Medicaid Other | Admitting: Family

## 2021-01-13 DIAGNOSIS — Z0000 Encounter for general adult medical examination without abnormal findings: Secondary | ICD-10-CM

## 2021-01-13 NOTE — Telephone Encounter (Signed)
I am happy to place lab orders, but please let pt know that most insurances are not paying for cpx labs. If she would still like them drawn, then you can order:  CMET CBC TSH Lipid panel  Dx Z0.00

## 2021-01-13 NOTE — Telephone Encounter (Signed)
Orders entered as future. Lvm for patient to be aware she needs to come the day of the physical at any time to get labs done so they can be billed under the annual physical.

## 2021-01-14 NOTE — Telephone Encounter (Signed)
Patient advised to come in for labs same day of ov.

## 2021-01-20 ENCOUNTER — Ambulatory Visit (INDEPENDENT_AMBULATORY_CARE_PROVIDER_SITE_OTHER): Payer: BC Managed Care – PPO | Admitting: Family

## 2021-01-20 ENCOUNTER — Other Ambulatory Visit: Payer: Self-pay

## 2021-01-20 ENCOUNTER — Encounter: Payer: Self-pay | Admitting: Family

## 2021-01-20 ENCOUNTER — Telehealth: Payer: Self-pay | Admitting: Family

## 2021-01-20 VITALS — BP 105/54 | HR 71 | Temp 98.6°F | Resp 16 | Ht 61.0 in | Wt 170.0 lb

## 2021-01-20 DIAGNOSIS — R053 Chronic cough: Secondary | ICD-10-CM | POA: Diagnosis not present

## 2021-01-20 DIAGNOSIS — Z0000 Encounter for general adult medical examination without abnormal findings: Secondary | ICD-10-CM | POA: Diagnosis not present

## 2021-01-20 LAB — TSH: TSH: 3.09 u[IU]/mL (ref 0.35–4.50)

## 2021-01-20 LAB — LIPID PANEL
Cholesterol: 181 mg/dL (ref 0–200)
HDL: 35.6 mg/dL — ABNORMAL LOW (ref >39.00)
LDL Cholesterol: 123 mg/dL — ABNORMAL HIGH (ref 0–99)
NonHDL: 145.43
Total CHOL/HDL Ratio: 5
Triglycerides: 112 mg/dL (ref 0.0–149.0)
VLDL: 22.4 mg/dL (ref 0.0–40.0)

## 2021-01-20 LAB — COMPREHENSIVE METABOLIC PANEL
ALT: 14 U/L (ref 0–35)
AST: 14 U/L (ref 0–37)
Albumin: 4.1 g/dL (ref 3.5–5.2)
Alkaline Phosphatase: 67 U/L (ref 39–117)
BUN: 13 mg/dL (ref 6–23)
CO2: 30 mEq/L (ref 19–32)
Calcium: 8.6 mg/dL (ref 8.4–10.5)
Chloride: 104 mEq/L (ref 96–112)
Creatinine, Ser: 0.7 mg/dL (ref 0.40–1.20)
GFR: 113.71 mL/min (ref >60.00)
Glucose, Bld: 86 mg/dL (ref 70–99)
Potassium: 4.5 mEq/L (ref 3.5–5.1)
Sodium: 140 mEq/L (ref 135–145)
Total Bilirubin: 0.7 mg/dL (ref 0.2–1.2)
Total Protein: 6.6 g/dL (ref 6.0–8.3)

## 2021-01-20 LAB — CBC
HCT: 41 % (ref 36.0–46.0)
Hemoglobin: 13.8 g/dL (ref 12.0–15.0)
MCHC: 33.7 g/dL (ref 30.0–36.0)
MCV: 91.3 fl (ref 78.0–100.0)
Platelets: 254 10*3/uL (ref 150.0–400.0)
RBC: 4.5 Mil/uL (ref 3.87–5.11)
RDW: 12.5 % (ref 11.5–15.5)
WBC: 6.9 10*3/uL (ref 4.0–10.5)

## 2021-01-20 NOTE — Patient Instructions (Addendum)
Please schedule a routine eye exam. Please schedule a routine dental exam. Continue to work on healthy diet, exercise and weight los.

## 2021-01-20 NOTE — Telephone Encounter (Signed)
Please call [facility] and request copy of pap smear.

## 2021-01-20 NOTE — Progress Notes (Signed)
Subjective:    Patient ID: Sandra Alvarado, female    DOB: 18-Jun-1986, 35 y.o.   MRN: [id number]  HPI  Patient presents today for complete physical.  Immunizations: 2018 tetanus, has not had covid vaccine Diet:  Got off track recently, plans to get back on track  Reports weight in November was 158.  Wt Readings from Last 3 Encounters:  01/20/21 170 lb (77.1 kg)  09/06/19 180 lb (81.6 kg)  05/11/19 181 lb (82.1 kg)  Exercise:  Some walking, some days Pap Smear:  Sees GYN (05/13/21) per patient Vision: due Dental: due  She continues to have intermittent chronic cough. She tried omeprazole previously without improvement. Albuterol only helps slightly.    Review of Systems  Constitutional: Negative for unexpected weight change.  HENT: Negative for rhinorrhea.   Respiratory: Positive for cough (Chronic- comes and goes, has associated chest congestion at times.  Inhaler helps some). Negative for shortness of breath.   Cardiovascular: Negative for leg swelling.  Genitourinary: Negative for dysuria, frequency and menstrual problem.  Musculoskeletal: Positive for arthralgias (occasional right medial knee pain- comes and goes.). Negative for myalgias.  Skin: Negative for rash.  Neurological: Positive for headaches (some tension headaches).  Hematological: Negative for adenopathy.  Psychiatric/Behavioral:       Mild anxiety, no depression       Past Medical History:  Diagnosis Date  . Abnormal Pap smear 09/2012   Colposcopy = CIN-I  . Acid reflux   . ASCUS (atypical squamous cells of undetermined significance) on Pap smear 2008  . BV (bacterial vaginosis) 10/02/07  . Condyloma 2010  . GBS carrier   . H/O candidiasis   . H/O nausea 10/2006  . H/O varicella   . 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 History of chicken pox   . History of herpes simplex infection   . Irregular periods/menstrual cycles      Social History   Socioeconomic History  . Marital status: Married    Spouse  name: Not on file  . Number of children: Not on file  . Years of education: Not on file  . Highest education level: Not on file  Occupational History  . Not on file  Tobacco Use  . Smoking status: Former Smoker    Types: Cigarettes    Quit date: 01/13/2015    Years since quitting: 6.0  . Smokeless tobacco: Never Used  Vaping Use  . Vaping Use: Never used  Substance and Sexual Activity  . Alcohol use: Yes    Comment: OCCASIONAL  . Drug use: No  . Sexual activity: Yes    Birth control/protection: None  Other Topics Concern  . Not on file  Social History Narrative   Lives with her daughter (2008) and her husband.     Works as a Network engineer at [employer]   Completed associates degree, some bachelors    Enjoys sleeping/reading, movies, eating and  spending time with daughter   Has a Boston Terrier   Born in [place], raised in [place], moved here for 11 years   Social Determinants of Corporate investment banker Strain: Not on [employer] Insecurity: Not on file  Transportation Needs: Not on file  Physical Activity: Not on file  Stress: Not on file  Social Connections: Not on file  Intimate Partner Violence: Not on file    Past Surgical History:  Procedure Laterality Date  . COLPOSCOPY  01/17/09  . DILATION AND EVACUATION N/A 03/21/2015  Procedure: DILATATION AND EVACUATION Ultrasound Guided;  Surgeon: Jaymes Graff, MD;  Location: [facility];  Service: Gynecology;  Laterality: N/A;  . WISDOM TOOTH EXTRACTION      Family History  Problem Relation Age of Onset  . Hypertension Maternal Grandmother   . Thyroid disease Maternal Grandmother   . Arthritis Maternal Grandmother   . Depression Maternal Grandmother   . Hyperlipidemia Paternal Grandmother   . Hypertension Father   . Hyperlipidemia Father   . Thyroid disease Mother   . Diabetes Mother        type II  . Alcohol abuse Maternal Grandfather   . Alcohol abuse Paternal Grandfather     No Known  Allergies  Current Outpatient Medications on File Prior to Visit  Medication Sig Dispense Refill  . albuterol (VENTOLIN HFA) 108 (90 Base) MCG/ACT inhaler Inhale 2 puffs into the lungs every 6 (six) hours as needed. 18 g 0  . Albuterol Sulfate 108 (90 Base) MCG/ACT AEPB Inhale 2 puffs into the lungs every 6 (six) hours as needed. 1 each 1   No current facility-administered medications on file prior to visit.    BP (!) 105/54 (BP Location: Right Arm, Patient Position: Sitting, Cuff Size: Small)   Pulse 71   Temp 98.6 F (37 C) (Oral)   Resp 16   Ht 5\' 1"  (1.549 m)   Wt 170 lb (77.1 kg)   SpO2 97%   BMI 32.12 kg/m    Objective:   Physical Exam  Physical Exam  Constitutional: She is oriented to person, place, and time. She appears well-developed and well-nourished. No distress.  HENT:  Head: Normocephalic and atraumatic.  Right Ear: Tympanic membrane and ear canal normal.  Left Ear: Tympanic membrane and ear canal normal.  Mouth/Throat: Oropharynx is clear and moist.  Eyes: Pupils are equal, round, and reactive to light. No scleral icterus.  Neck: Normal range of motion. No thyromegaly present.  Cardiovascular: Normal rate and regular rhythm.   No murmur heard. Pulmonary/Chest: Effort normal and breath sounds normal. No respiratory distress. He has no wheezes. She has no rales. She exhibits no tenderness.  Abdominal: Soft. Bowel sounds are normal. She exhibits no distension and no mass. There is no tenderness. There is no rebound and no guarding.  Musculoskeletal: She exhibits no edema.  Lymphadenopathy:    She has no cervical adenopathy.  Neurological: She is alert and oriented to person, place, and time. She has normal patellar reflexes. She exhibits normal muscle tone. Coordination normal.  Skin: Skin is warm and dry.  Psychiatric: She has a normal mood and affect. Her behavior is normal. Judgment and thought content normal.  Breasts: Examined lying Right: Without masses,  retractions, discharge or axillary adenopathy.  Left: Without masses, retractions, discharge or axillary adenopathy.           Assessment & Plan:         Assessment & Plan:

## 2021-01-21 DIAGNOSIS — R053 Chronic cough: Secondary | ICD-10-CM | POA: Insufficient documentation

## 2021-01-21 DIAGNOSIS — Z0000 Encounter for general adult medical examination without abnormal findings: Secondary | ICD-10-CM | POA: Insufficient documentation

## 2021-01-21 NOTE — Assessment & Plan Note (Signed)
Discussed healthy diet, exercise and weight loss.  Encouraged pt to get covid vaccine.  Pap up to date. Will request copy of the report. Recommended that she schedule routine dental and vision exams.

## 2021-01-21 NOTE — Assessment & Plan Note (Signed)
Unchanged. Will refer to pulmonology for further evaluation.

## 2021-01-21 NOTE — Telephone Encounter (Signed)
Records request faxed 

## 2021-02-26 ENCOUNTER — Other Ambulatory Visit: Payer: Self-pay

## 2021-02-26 ENCOUNTER — Ambulatory Visit (INDEPENDENT_AMBULATORY_CARE_PROVIDER_SITE_OTHER): Payer: BC Managed Care – PPO | Admitting: Pulmonary Disease

## 2021-02-26 VITALS — BP 102/68 | HR 98 | Ht 61.0 in | Wt 169.4 lb

## 2021-02-26 DIAGNOSIS — R053 Chronic cough: Secondary | ICD-10-CM

## 2021-02-26 DIAGNOSIS — R059 Cough, unspecified: Secondary | ICD-10-CM

## 2021-02-26 DIAGNOSIS — R0789 Other chest pain: Secondary | ICD-10-CM | POA: Diagnosis not present

## 2021-02-26 MED ORDER — SPACER/AERO-HOLDING CHAMBERS DEVI: 1 refills | Status: AC

## 2021-02-26 MED ORDER — ALBUTEROL SULFATE HFA 108 (90 BASE) MCG/ACT IN AERS: 2.0000 | INHALATION_SPRAY | Freq: Four times a day (QID) | RESPIRATORY_TRACT | 0 refills | Status: DC | PRN

## 2021-02-26 MED ORDER — BUDESONIDE-FORMOTEROL FUMARATE 160-4.5 MCG/ACT IN AERO: 2.0000 | INHALATION_SPRAY | Freq: Two times a day (BID) | RESPIRATORY_TRACT | 6 refills | Status: DC

## 2021-02-26 NOTE — Progress Notes (Signed)
Synopsis: Referred in July 2022 for chronic cough by [name], NP  Subjective:   PATIENT ID: Sandra Alvarado GENDER: female DOB: September 13, 1985, MRN: [id number]  Chief Complaint  Patient presents with   Consult    Dry cough and tightness in chest, happiness a few times during the day.     PMH of chronic cough, has been going on for 3 years. Gets better with symbicort inhaler. She doesn't use it regularly. She also has an albuterol. Associated with occasional she tightness in the chest.   Cough This is a chronic problem. The current episode started more than 1 year ago. The problem has been waxing and waning. Episode frequency: periodic. The cough is Non-productive. Pertinent negatives include no chest pain, chills, fever, headaches, heartburn, hemoptysis, myalgias, rash, sore throat, shortness of breath, weight loss or wheezing. Nothing aggravates the symptoms. Risk factors for lung disease include smoking/tobacco exposure (no animals). She has tried steroid inhaler and a beta-agonist inhaler for the symptoms. There is no history of environmental allergies.    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   History of chicken pox    History of herpes simplex infection    Irregular periods/menstrual cycles      Family History  Problem Relation Age of Onset   Hypertension Maternal Grandmother    Thyroid disease Maternal Grandmother    Arthritis Maternal Grandmother    Depression Maternal Grandmother    Hyperlipidemia Paternal Grandmother    Hypertension Father    Hyperlipidemia Father    Thyroid disease Mother    Diabetes Mother        type II   Alcohol abuse Maternal Grandfather     Alcohol abuse Paternal Grandfather      Past Surgical History:  Procedure Laterality Date   COLPOSCOPY  01/17/09   DILATION AND EVACUATION N/A 03/21/2015   Procedure: DILATATION AND EVACUATION Ultrasound Guided;  Surgeon: Jaymes Graff, MD;  Location: [facility];  Service: Gynecology;  Laterality: N/A;   WISDOM TOOTH EXTRACTION      Social History   Socioeconomic History   Marital status: Married    Spouse name: Not on file   Number of children: Not on file   Years of education: Not on file   Highest education level: Not on file  Occupational History   Not on file  Tobacco Use   Smoking status: Former    Pack years: 0.00    Types: Cigarettes    Quit date: 01/13/2015    Years since quitting: 6.1   Smokeless tobacco: Never  Vaping Use   Vaping Use: Never used  Substance and Sexual Activity   Alcohol use: Yes    Comment: OCCASIONAL   Drug use: No   Sexual activity: Yes    Birth control/protection: None  Other Topics Concern   Not on file  Social History Narrative   Lives with her daughter (2008) and her husband.     Works as a Network engineer at [employer]   Completed associates degree, some bachelors    Enjoys sleeping/reading, movies, eating and  spending time with daughter  Has a Boston Terrier   Born in [place], raised in [place], moved here for 11 years   Social Determinants of Corporate investment banker Strain: Not on [employer] Insecurity: Not on file  Transportation Needs: Not on file  Physical Activity: Not on file  Stress: Not on file  Social Connections: Not on file  Intimate Partner Violence: Not on file     No Known Allergies   Outpatient Medications Prior to Visit  Medication Sig Dispense Refill   albuterol (VENTOLIN HFA) 108 (90 Base) MCG/ACT inhaler Inhale 2 puffs into the lungs every 6 (six) hours as needed. 18 g 0   Albuterol Sulfate 108 (90 Base) MCG/ACT AEPB Inhale 2 puffs into the lungs every 6 (six) hours as needed. 1 each 1   No  facility-administered medications prior to visit.    Review of Systems  Constitutional:  Negative for chills, fever, malaise/fatigue and weight loss.  HENT:  Negative for hearing loss, sore throat and tinnitus.   Eyes:  Negative for blurred vision and double vision.  Respiratory:  Positive for cough. Negative for hemoptysis, sputum production, shortness of breath, wheezing and stridor.   Cardiovascular:  Negative for chest pain, palpitations, orthopnea, leg swelling and PND.  Gastrointestinal:  Negative for abdominal pain, constipation, diarrhea, heartburn, nausea and vomiting.  Genitourinary:  Negative for dysuria, hematuria and urgency.  Musculoskeletal:  Negative for joint pain and myalgias.  Skin:  Negative for itching and rash.  Neurological:  Negative for dizziness, tingling, weakness and headaches.  Endo/Heme/Allergies:  Negative for environmental allergies. Does not bruise/bleed easily.  Psychiatric/Behavioral:  Negative for depression. The patient is not nervous/anxious and does not have insomnia.   All other systems reviewed and are negative.   Objective:  Physical Exam Vitals reviewed.  Constitutional:      General: She is not in acute distress.    Appearance: She is well-developed.  HENT:     Head: Normocephalic and atraumatic.  Eyes:     General: No scleral icterus.    Conjunctiva/sclera: Conjunctivae normal.     Pupils: Pupils are equal, round, and reactive to light.  Neck:     Vascular: No JVD.     Trachea: No tracheal deviation.  Cardiovascular:     Rate and Rhythm: Normal rate and regular rhythm.     Heart sounds: Normal heart sounds. No murmur heard. Pulmonary:     Effort: Pulmonary effort is normal. No tachypnea, accessory muscle usage or respiratory distress.     Breath sounds: No stridor. No wheezing, rhonchi or rales.  Abdominal:     General: Bowel sounds are normal. There is no distension.     Palpations: Abdomen is soft.     Tenderness: There is no  abdominal tenderness.  Musculoskeletal:        General: No tenderness.     Cervical back: Neck supple.  Lymphadenopathy:     Cervical: No cervical adenopathy.  Skin:    General: Skin is warm and dry.     Capillary Refill: Capillary refill takes less than 2 seconds.     Findings: No rash.  Neurological:     Mental Status: She is alert and oriented to person, place, and time.  Psychiatric:        Behavior: Behavior normal.     Vitals:   02/26/21 1544  BP: 102/68  Pulse: 98  SpO2: 98%  Weight: 169 lb 6.4 oz (76.8 kg)  Height: 5\' 1"  (1.549 m)  98% on RA BMI Readings from Last 3 Encounters:  02/26/21 32.01 kg/m  01/20/21 32.12 kg/m  09/06/19 34.01 kg/m   Wt Readings from Last 3 Encounters:  02/26/21 169 lb 6.4 oz (76.8 kg)  01/20/21 170 lb (77.1 kg)  09/06/19 180 lb (81.6 kg)     CBC    Component Value Date/Time   WBC 6.9 01/20/2021 1308   RBC 4.50 01/20/2021 1308   HGB 13.8 01/20/2021 1308   HCT 41.0 01/20/2021 1308   PLT 254.0 01/20/2021 1308   MCV 91.3 01/20/2021 1308   MCH 29.8 05/11/2019 1512   MCHC 33.7 01/20/2021 1308   RDW 12.5 01/20/2021 1308   LYMPHSABS 2,925 05/11/2019 1512   MONOABS 0.2 01/23/2011 1952   EOSABS 31 05/11/2019 1512   BASOSABS 41 05/11/2019 1512     Chest Imaging: none  Pulmonary Functions Testing Results: No flowsheet data found.  FeNO: none  Pathology: none  Echocardiogram: none  Heart Catheterization: none    Assessment & Plan:     ICD-10-CM   1. Cough  R05.9     2. Chest tightness  R07.89       Discussion:  35 yo FM, here for evaluation of chronic, episodic, nonproductive cough that occasionally occurs with chest tightness.  She has noticed a few times in the past 3 years that she had some chest congestion associated with the cough.  But normally it happens episodically and will go away with the use of her albuterol inhaler or she feels better with the use of Symbicort.  She does not use this  regularly.  She does not have any reflux symptoms or other known triggers.  Plan: We discussed a more conservative approach on her evaluation of cough. 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  As she does have symptoms that improved with albuterol and Symbicort use. Will recommend her starting a routine daily regiment of Symbicort 160, 2 puffs twice daily with spacer and to continue the use of albuterol as needed. I would like to give this at least 6 to 8 weeks to see if her symptoms completely go away. In the future we can also trial PPI to see if this improves cough or antihistamines but she does not seem to have other significant symptoms related to these etiologies.  If her cough continues to be persistent or worsen I think the next best step would be pulmonary function test.  If pulmonary function tests were unrevealing or abnormal may need to consider axial CT imaging in the future.  Patient to follow-up and monitor symptoms and monitor for any other onsets or triggers.  She is to let us know how her symptoms do.  If any of these change she can see [place] sooner however if not follow-up in 1 year.   Current Outpatient Medications:    albuterol (VENTOLIN HFA) 108 (90 Base) MCG/ACT inhaler, Inhale 2 puffs into the lungs every 6 (six) hours as needed., Disp: 18 g, Rfl: 0   Albuterol Sulfate 108 (90 Base) MCG/ACT AEPB, Inhale 2 puffs into the lungs every 6 (six) hours as needed., Disp: 1 each, Rfl: 1    Josephine Igo, DO [facility] 02/26/2021 3:50 PM

## 2021-02-26 NOTE — Patient Instructions (Addendum)
Thank you for visiting Dr. Tonia Brooms at [facility]. Today we recommend the following:  Symbicort 160 BID with spacer  Albuterol as needed New prescriptions for both  Return in about 1 year (around 02/26/2022), or if symptoms worsen or fail to improve.    Please do your part to reduce the spread of COVID-19.

## 2021-03-07 DIAGNOSIS — Z20822 Contact with and (suspected) exposure to covid-19: Secondary | ICD-10-CM | POA: Diagnosis not present

## 2021-06-17 DIAGNOSIS — Z124 Encounter for screening for malignant neoplasm of cervix: Secondary | ICD-10-CM | POA: Diagnosis not present

## 2021-06-17 DIAGNOSIS — R87619 Unspecified abnormal cytological findings in specimens from cervix uteri: Secondary | ICD-10-CM | POA: Diagnosis not present

## 2021-06-17 DIAGNOSIS — Z113 Encounter for screening for infections with a predominantly sexual mode of transmission: Secondary | ICD-10-CM | POA: Diagnosis not present

## 2021-06-17 DIAGNOSIS — Z01419 Encounter for gynecological examination (general) (routine) without abnormal findings: Secondary | ICD-10-CM | POA: Diagnosis not present

## 2021-06-19 ENCOUNTER — Other Ambulatory Visit: Payer: Self-pay | Admitting: Obstetrics and Gynecology

## 2021-06-19 DIAGNOSIS — E049 Nontoxic goiter, unspecified: Secondary | ICD-10-CM

## 2021-07-30 DIAGNOSIS — R52 Pain, unspecified: Secondary | ICD-10-CM | POA: Diagnosis not present

## 2021-07-30 DIAGNOSIS — N946 Dysmenorrhea, unspecified: Secondary | ICD-10-CM | POA: Diagnosis not present

## 2021-08-10 ENCOUNTER — Ambulatory Visit
Admission: RE | Admit: 2021-08-10 | Discharge: 2021-08-10 | Disposition: A | Discharge status: Home / Self Care | Payer: BC Managed Care – PPO | Source: Ambulatory Visit | Attending: Obstetrics and Gynecology | Admitting: Obstetrics and Gynecology

## 2021-08-10 DIAGNOSIS — E049 Nontoxic goiter, unspecified: Secondary | ICD-10-CM

## 2021-08-27 ENCOUNTER — Telehealth: Payer: Self-pay | Admitting: Family

## 2021-08-27 NOTE — Telephone Encounter (Signed)
Pt called to go over her ultrasound results, this was ordered by her gyno, but wanted her primary to go over results. Pleas advise.

## 2021-08-27 NOTE — Telephone Encounter (Signed)
Patient advised thyroid US report from 08-10-21 reads: Normal thyroid ultrasound for age  Patient advised for any other information she needs to call the ordering provider.

## 2021-08-27 NOTE — Telephone Encounter (Signed)
Called patient back but no answer, lvm for patient to call back

## 2021-10-07 ENCOUNTER — Ambulatory Visit: Payer: BC Managed Care – PPO | Admitting: Medical

## 2021-10-07 ENCOUNTER — Ambulatory Visit: Payer: BC Managed Care – PPO | Admitting: Family

## 2021-10-07 ENCOUNTER — Encounter: Payer: Self-pay | Admitting: Medical

## 2021-10-07 VITALS — BP 112/60 | HR 88 | Temp 98.3°F | Resp 16 | Ht 61.0 in | Wt 178.8 lb

## 2021-10-07 DIAGNOSIS — H6690 Otitis media, unspecified, unspecified ear: Secondary | ICD-10-CM

## 2021-10-07 DIAGNOSIS — L089 Local infection of the skin and subcutaneous tissue, unspecified: Secondary | ICD-10-CM | POA: Diagnosis not present

## 2021-10-07 MED ORDER — DOXYCYCLINE HYCLATE 100 MG PO TABS: 100.0000 mg | ORAL_TABLET | Freq: Two times a day (BID) | ORAL | 0 refills | Status: DC

## 2021-10-07 MED ORDER — AMOXICILLIN-POT CLAVULANATE 875-125 MG PO TABS: 1.0000 | ORAL_TABLET | Freq: Two times a day (BID) | ORAL | 0 refills | Status: DC

## 2021-10-07 NOTE — Progress Notes (Addendum)
Subjective:    Patient ID: Sandra Alvarado, female    DOB: [birth year], 36 y.o.   MRN: [id number]  HPI   Rt ear pain after she got ear piercing. She got the piercing on February 1st. This friday had pain and some redness. Pt states Saturday little less red. She has moderate swelling mostly on back of rt ear. Pt states when cleans area has pain.   Lmp- Sep 14, 2021.      Review of Systems  Constitutional:  Negative for chills, fatigue and fever.  HENT:  Negative for congestion and drooling.        Rt ear infection.  Respiratory:  Negative for cough, chest tightness, shortness of breath and wheezing.   Cardiovascular:  Negative for chest pain and palpitations.  Gastrointestinal:  Negative for abdominal pain.  Genitourinary:  Negative for dyspareunia and dysuria.  Musculoskeletal:  Negative for back pain and joint swelling.  Skin:        See hpi.  Hematological:  Negative for adenopathy.  Psychiatric/Behavioral:  Negative for behavioral problems.     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   History of chicken pox    History of herpes simplex infection    Irregular periods/menstrual cycles      Social History   Socioeconomic History   Marital status: Married    Spouse name: Not on file   Number of children: Not on file   Years of education: Not on file   Highest education level: Not on file  Occupational History   Not on file  Tobacco Use   Smoking status: Former    Types: Cigarettes    Quit date: 01/13/2015    Years since quitting: 6.7   Smokeless tobacco: Never  Vaping Use   Vaping Use: Never used  Substance and Sexual Activity   Alcohol use: Yes    Comment:  OCCASIONAL   Drug use: No   Sexual activity: Yes    Birth control/protection: None  Other Topics Concern   Not on file  Social History Narrative   Lives with her daughter (2008) and her husband.     Works as a Systems developer at credit [employer]   Completed associates degree, some bachelors    Enjoys sleeping/reading, movies, eating and  spending time with daughter   Has a Boston Terrier   Born in [place], raised in [place], moved here for 11 years   Social Determinants of Health   Financial Resource Strain: Not on [employer] Insecurity: Not on file  Transportation Needs: Not on file  Physical Activity: Not on file  Stress: Not on file  Social Connections: Not on file  Intimate Partner Violence: Not on file    Past Surgical History:  Procedure Laterality Date   COLPOSCOPY  01/17/09   [place] N/A 03/21/2015   Procedure: DILATATION AND EVACUATION Ultrasound Guided;  Surgeon: Crawford Givens, MD;  Location: [facility];  Service: Gynecology;  Laterality: N/A;   WISDOM TOOTH EXTRACTION      Family History  Problem Relation Age of Onset   Hypertension Maternal Grandmother  Thyroid disease Maternal Grandmother    Arthritis Maternal Grandmother    Depression Maternal Grandmother    Hyperlipidemia Paternal Grandmother    Hypertension Father    Hyperlipidemia Father    Thyroid disease Mother    Diabetes Mother        type II   Alcohol abuse Maternal Grandfather    Alcohol abuse Paternal Grandfather     No Known Allergies  Current Outpatient Medications on File Prior to Visit  Medication Sig Dispense Refill   albuterol (VENTOLIN HFA) 108 (90 Base) MCG/ACT inhaler Inhale 2 puffs into the lungs every 6 (six) hours as needed. 18 g 0   Albuterol Sulfate 108 (90 Base) MCG/ACT AEPB Inhale 2 puffs into the lungs every 6 (six) hours as needed. 1 each 1   budesonide-formoterol (SYMBICORT) 160-4.5 MCG/ACT inhaler Inhale 2 puffs into the lungs 2 (two) times daily. 3 each 6    Spacer/Aero-Holding Dorise Bullion Use with inhaler 1 each 1   No current facility-administered medications on file prior to visit.    BP 112/60    Pulse 88    Temp 98.3 F (36.8 C)    Resp 16    Ht 5\' 1"  (1.549 m)    Wt 178 lb 12.8 oz (81.1 kg)    SpO2 98%    BMI 33.78 kg/m       Objective:   Physical Exam  General- No acute distress. Pleasant patient. Neck- Full range of motion, no jvd Lungs- Clear, even and unlabored. Heart- regular rate and rhythm. Neurologic- CNII- XII grossly intact.  Ear- rt side upper ear piercing in cartilage      Assessment & Plan:   Patient Instructions  Rt  upper ear infection in region of cartilage. Most conservative advisement would be to start doxycyline and remove the earring as fb is source of infection. Concern about infection persisting and if severe effecting cartilage and shape of ear.   Follow up on Monday or sooner if needed

## 2021-10-07 NOTE — Addendum Note (Signed)
Addended by: Gwenevere Abbot on: 10/07/2021 01:56 PM   Modules accepted: Orders

## 2021-10-07 NOTE — Patient Instructions (Addendum)
Rt  upper ear infection in region of cartilage. Most conservative advisement would be to start doxycyline and remove the earring as fb is source of infection. Concern about infection persisting and if severe effecting cartilage and shape of ear.   Follow up on Monday or sooner if needed  Pt does not want augmentin. So switched to doxycycline. Rx advisment given

## 2021-10-12 ENCOUNTER — Ambulatory Visit: Payer: BC Managed Care – PPO | Admitting: Medical

## 2021-10-16 ENCOUNTER — Ambulatory Visit (INDEPENDENT_AMBULATORY_CARE_PROVIDER_SITE_OTHER): Payer: BC Managed Care – PPO | Admitting: Medical

## 2021-10-16 VITALS — BP 100/60 | HR 69 | Resp 18 | Ht 61.0 in | Wt 178.0 lb

## 2021-10-16 DIAGNOSIS — L089 Local infection of the skin and subcutaneous tissue, unspecified: Secondary | ICD-10-CM | POA: Diagnosis not present

## 2021-10-16 MED ORDER — DOXYCYCLINE HYCLATE 100 MG PO TABS: 100.0000 mg | ORAL_TABLET | Freq: Two times a day (BID) | ORAL | 0 refills | Status: DC

## 2021-10-16 NOTE — Progress Notes (Signed)
° °  Subjective:    Patient ID: Sandra Alvarado, female    DOB: [birth year], 36 y.o.   MRN: [id number]  HPI Pt states her rt ear feels normal now follow up from last visit). She states ear  felt significant improvement after 2 days of antibiotic. I had counseled safest way to approach infection is to take antibiotic and take out new earing. She opted to not take out and continue antibiotic. The area did well. No in for follow up.   Review of Systems  Constitutional:  Negative for chills and fatigue.  Respiratory:  Negative for cough, choking, chest tightness, shortness of breath and wheezing.   Cardiovascular:  Negative for chest pain and palpitations.  Gastrointestinal:  Negative for abdominal pain.  Musculoskeletal:  Negative for back pain.  Skin:        See hpi.  Hematological:  Negative for adenopathy. Does not bruise/bleed easily.  Psychiatric/Behavioral:  Negative for behavioral problems and confusion.       Objective:   Physical Exam  General- no acute distress. Rt ear- the piercing area no longer swollen, red or tender. Overall color is good. No dc.       Assessment & Plan:   Patient Instructions  Rt ear infection in region of cartilage.  Infection occurred after new piercing.  Prescription of doxycycline given.  Had recommended cautious approach to remove the earring and take antibiotic.  Patient chose to keep the earring and overall the infection does look resolved.  At this point for caution sake decided to give 3 additional days of doxycycline.  Advised over the next week to watch area carefully for any recurrent infection.  Going forward if he infection were to reoccur stressed that I think would need another full round of antibiotic and removal of the area.  Patient expressed understanding.  Follow-up as ready scheduled with PCP or sooner if needed.   Esperanza Richters, PA-C

## 2021-10-16 NOTE — Patient Instructions (Signed)
Rt ear infection in region of cartilage.  Infection occurred after new piercing.  Prescription of doxycycline given.  Had recommended cautious approach to remove the earring and take antibiotic.  Patient chose to keep the earring and overall the infection does look resolved.  At this point for caution sake decided to give 3 additional days of doxycycline.  Advised over the next week to watch area carefully for any recurrent infection.  Going forward if he infection were to reoccur stressed that I think would need another full round of antibiotic and removal of the area.  Patient expressed understanding.  Follow-up as ready scheduled with PCP or sooner if needed.

## 2022-01-25 ENCOUNTER — Telehealth: Payer: Self-pay | Admitting: Family

## 2022-01-25 ENCOUNTER — Ambulatory Visit (INDEPENDENT_AMBULATORY_CARE_PROVIDER_SITE_OTHER): Payer: BC Managed Care – PPO | Admitting: Family

## 2022-01-25 ENCOUNTER — Encounter: Payer: Self-pay | Admitting: Family

## 2022-01-25 VITALS — BP 105/56 | HR 70 | Temp 98.4°F | Resp 16 | Ht 61.0 in | Wt 178.0 lb

## 2022-01-25 DIAGNOSIS — H6123 Impacted cerumen, bilateral: Secondary | ICD-10-CM | POA: Diagnosis not present

## 2022-01-25 DIAGNOSIS — R053 Chronic cough: Secondary | ICD-10-CM

## 2022-01-25 DIAGNOSIS — G4700 Insomnia, unspecified: Secondary | ICD-10-CM

## 2022-01-25 DIAGNOSIS — H6120 Impacted cerumen, unspecified ear: Secondary | ICD-10-CM | POA: Insufficient documentation

## 2022-01-25 DIAGNOSIS — Z0000 Encounter for general adult medical examination without abnormal findings: Secondary | ICD-10-CM | POA: Diagnosis not present

## 2022-01-25 NOTE — Assessment & Plan Note (Addendum)
Wt Readings from Last 3 Encounters:  01/25/22 178 lb (80.7 kg)  10/16/21 178 lb (80.7 kg)  10/07/21 178 lb 12.8 oz (81.1 kg)   Discussed healthy diet, exercise, weight loss, need to quit smoking. Advised her to schedule routine vision/dental.

## 2022-01-25 NOTE — Assessment & Plan Note (Signed)
Ceruminosis is noted.  Wax is removed by syringing by CMA. Pt tolerated well. Normal TM's bilaterally noted.  Instructions for home care to prevent wax buildup are given.

## 2022-01-25 NOTE — Progress Notes (Signed)
Subjective:   By signing my name below, I, Sandra Alvarado, attest that this documentation has been prepared under the direction and in the presence of [name], NP 01/25/2022    Patient ID: Sandra Alvarado, female    DOB: 12-30-1985, 36 y.o.   MRN: [id number]  Chief Complaint  Patient presents with   Annual Exam         HPI Patient is in today for a comprehensive physical exam.   Sleep- She has difficulty falling asleep and trouble staying asleep. She has low energy since she started having difficulty sleeping. She typically sleeps around 1-3 am and wakes up at 8 am. She works from 8am-5pm. She sleeps in longer during the weekend. She has tried melatonin and found no change in her sleep. She has tried sleeping medication and found it helped but she prefers not taking them. She drinks coffee around lunch and occasionally at dinner time.   Cough- She continues having a persistent cough. She found for the past couple of months her symptoms were improving but they returned after she started smoking.   Constipation- She continues having episodes of constipation. She does not have constipation at this time.   Anxiety- She has increased anxiety due to situations in her family life. She also finds her lack of sleep is worsening her anxiety. She is interested in seeing a counselor.   Ear wax- She has cerumen build up in both ears and is using peroxide to flush her ears. She also reports her left ear recently developed pain.   Menstrual cycle- She reports recently her flow is weaker and her cramping is worse. She also has pain in her lower abdomen similar to her menstrual cycle when she is off her menstrual cycle. She is planning on following up with her GYN specialist for these symptoms.    Social history: She has no surgical procedures in the past year. She has no changes to her family medical history. She rarely drinks alcohol. She recently started smoking 2 cigarettes daily for the  past month. She is planning to stop smoking. She does not use drugs.  She denies having any unexpected weight change, ear pain, hearing loss and rhinorrhea, visual disturbance, chest pain and leg swelling, nausea, vomiting, diarrhea, constipation, blood in stool, or dysuria and frequency, for myalgias and arthralgias, rash, headaches, adenopathy, depression at this time. Immunizations: She is UTD on tetanus vaccine. She does not have any Covid-19 vaccines and is not interested in receiving any.  Diet: She is not managing a healthy diet. She is drinking more sodas. She drinks around a 16 oz can daily. She is eating out more frequently. She does not eat frequently but when she does she does not eat healthy.  Exercise: She does not participate in regular exercise. She is willing to try exercising during her lunch break.  Pap Smear: She reports last completing it 04/2021. She receives them at her GYN office.   Dental: She is due for dental care. She is interested in scheduling an appointment.  Vision: She is due for vision care and is interested in scheduling an appointment.    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   History of chicken pox    History of herpes simplex infection    Irregular periods/menstrual cycles     Past Surgical History:  Procedure Laterality Date   COLPOSCOPY  01/17/09   DILATION AND EVACUATION N/A 03/21/2015   Procedure: DILATATION AND EVACUATION Ultrasound Guided;  Surgeon: Jaymes Graff, MD;  Location: [facility];  Service: Gynecology;  Laterality: N/A;   WISDOM TOOTH EXTRACTION      Family  History  Problem Relation Age of Onset   Hypertension Maternal Grandmother    Thyroid disease Maternal Grandmother    Arthritis Maternal Grandmother    Depression Maternal Grandmother    Hyperlipidemia Paternal Grandmother    Hypertension Father    Hyperlipidemia Father    Thyroid disease Mother    Diabetes Mother        type II   Alcohol abuse Maternal Grandfather    Alcohol abuse Paternal Grandfather     Social History   Socioeconomic History   Marital status: Married    Spouse name: Not on file   Number of children: Not on file   Years of education: Not on file   Highest education level: Not on file  Occupational History   Not on file  Tobacco Use   Smoking status: Every Day    Packs/day: 0.20    Types: Cigarettes    Last attempt to quit: 01/13/2015    Years since quitting: 7.0   Smokeless tobacco: Never  Vaping Use   Vaping Use: Never used  Substance and Sexual Activity   Alcohol use: Yes    Comment: rare   Drug use: No   Sexual activity: Yes    Birth control/protection: None  Other Topics Concern   Not on file  Social History Narrative   Lives with her daughter (2008) and her husband.     Works as a Network engineer at [employer]   Completed associates degree, some bachelors    Enjoys sleeping/reading, movies, eating and  spending time with daughter   Has a Boston Terrier   Born in [place], raised in [place], moved here for 11 years   Social Determinants of Corporate investment banker Strain: Not on [employer] Insecurity: Not on file  Transportation Needs: Not on file  Physical Activity: Not on file  Stress: Not on file  Social Connections: Not on file  Intimate Partner Violence: Not on file    Outpatient Medications Prior to Visit  Medication Sig Dispense Refill   albuterol (VENTOLIN HFA) 108 (90 Base) MCG/ACT inhaler Inhale 2 puffs into the lungs every 6 (six) hours as needed. 18 g 0   Albuterol Sulfate 108 (90 Base) MCG/ACT AEPB Inhale 2  puffs into the lungs every 6 (six) hours as needed. 1 each 1   budesonide-formoterol (SYMBICORT) 160-4.5 MCG/ACT inhaler Inhale 2 puffs into the lungs 2 (two) times daily. 3 each 6   Spacer/Aero-Holding Chambers DEVI Use with inhaler 1 each 1   doxycycline (VIBRA-TABS) 100 MG tablet Take 1 tablet (100 mg total) by mouth 2 (two) times daily. 20 tablet 0   doxycycline (VIBRA-TABS) 100 MG tablet Take 1 tablet (100 mg total) by mouth 2 (two) times daily. 6 tablet 0   No facility-administered medications prior to visit.    No Known Allergies  Review of  Systems  Constitutional:  Negative for fever.  HENT:  Negative for congestion, sinus pain and sore throat.        (+)cerumen build up in bilateral ears  Respiratory:  Negative for shortness of breath and wheezing.   Cardiovascular:  Negative for chest pain, palpitations and leg swelling.  Gastrointestinal:  Negative for blood in stool, constipation, diarrhea, nausea and vomiting.  Genitourinary:  Negative for dysuria, frequency and hematuria.  Musculoskeletal:  Negative for joint pain and myalgias.  Skin:        (-)New moles  Neurological:  Negative for headaches.  Psychiatric/Behavioral:  The patient is nervous/anxious and has insomnia.       Objective:    Physical Exam Constitutional:      General: She is not in acute distress.    Appearance: Normal appearance. She is not ill-appearing.  HENT:     Head: Normocephalic and atraumatic.     Right Ear: Tympanic membrane, ear canal and external ear normal.     Left Ear: Ear canal and external ear normal. There is impacted cerumen.  Eyes:     Extraocular Movements: Extraocular movements intact.     Right eye: No nystagmus.     Left eye: No nystagmus.     Pupils: Pupils are equal, round, and reactive to light.  Neck:     Thyroid: No thyroid tenderness.  Cardiovascular:     Rate and Rhythm: Normal rate and regular rhythm.     Heart sounds: Normal heart sounds. No murmur heard.   No  gallop.  Pulmonary:     Effort: Pulmonary effort is normal. No respiratory distress.     Breath sounds: Normal breath sounds. No wheezing or rales.  Abdominal:     General: There is no distension.     Palpations: Abdomen is soft.     Tenderness: There is no abdominal tenderness. There is no guarding.  Musculoskeletal:     Comments: 5/5 strength in both upper and lower extremities  Lymphadenopathy:     Cervical: No cervical adenopathy.  Skin:    General: Skin is warm and dry.  Neurological:     Mental Status: She is alert and oriented to person, place, and time.     Deep Tendon Reflexes:     Reflex Scores:      Patellar reflexes are 2+ on the right side and 2+ on the left side. Psychiatric:        Judgment: Judgment normal.    BP (!) 105/56 (BP Location: Right Arm, Patient Position: Sitting, Cuff Size: Small)   Pulse 70   Temp 98.4 F (36.9 C) (Oral)   Resp 16   Ht  (1.549 m)   Wt 178 lb (80.7 kg)   SpO2 99%   BMI 33.63 kg/m  Wt Readings from Last 3 Encounters:  01/25/22 178 lb (80.7 kg)  10/16/21 178 lb (80.7 kg)  10/07/21 178 lb 12.8 oz (81.1 kg)       Assessment & Plan:   Problem List Items Addressed This Visit       Unprioritized   Preventative health care - Primary    Wt Readings from Last 3 Encounters:  01/25/22 178 lb (80.7 kg)  10/16/21 178 lb (80.7 kg)  10/07/21 178 lb 12.8 oz (81.1 kg)  Discussed healthy diet, exercise, weight loss, need to quit smoking. Advised her to schedule routine vision/dental.       Insomnia    Discussed good sleep hygiene techniques. Marital stress  is also probably a factor.  Gave her an handout on counseling.        Chronic cough    Improved currently.        Ceruminosis, bilateral    Ceruminosis is noted.  Wax is removed by syringing by CMA. Pt tolerated well. Normal TM's bilaterally noted.  Instructions for home care to prevent wax buildup are given.          No orders of the defined types were placed  in this encounter.   I, Lemont FillersMelissa S O'Sullivan, NP, personally preformed the services described in this documentation.  All medical record entries made by the scribe were at my direction and in my presence.  I have reviewed the chart and discharge instructions (if applicable) and agree that the record reflects my personal performance and is accurate and complete. 01/25/2022   I,Sandra Alvarado,acting as a scribe for Lemont FillersMelissa S O'Sullivan, NP.,have documented all relevant documentation on the behalf of Lemont FillersMelissa S O'Sullivan, NP,as directed by  Lemont FillersMelissa S O'Sullivan, NP while in the presence of Lemont FillersMelissa S O'Sullivan, NP.   Lemont FillersMelissa S O'Sullivan, NP

## 2022-01-25 NOTE — Telephone Encounter (Signed)
Please call [facility] and request pap report.

## 2022-01-25 NOTE — Patient Instructions (Addendum)
Please schedule vision and dental exams.  Add 15 minutes of walking each day at lunch. Stop sodas. Quit smoking.

## 2022-01-25 NOTE — Assessment & Plan Note (Addendum)
Discussed good sleep hygiene techniques. Marital stress is also probably a factor.  Gave her an handout on counseling.

## 2022-01-25 NOTE — Telephone Encounter (Signed)
Records release faxed 

## 2022-01-25 NOTE — Assessment & Plan Note (Signed)
Improved currently 

## 2022-07-28 LAB — HM PAP SMEAR

## 2022-07-29 DIAGNOSIS — R87610 Atypical squamous cells of undetermined significance on cytologic smear of cervix (ASC-US): Secondary | ICD-10-CM | POA: Diagnosis not present

## 2022-07-29 DIAGNOSIS — Z113 Encounter for screening for infections with a predominantly sexual mode of transmission: Secondary | ICD-10-CM | POA: Diagnosis not present

## 2022-07-29 DIAGNOSIS — Z01419 Encounter for gynecological examination (general) (routine) without abnormal findings: Secondary | ICD-10-CM | POA: Diagnosis not present

## 2022-07-29 DIAGNOSIS — Z6833 Body mass index (BMI) 33.0-33.9, adult: Secondary | ICD-10-CM | POA: Diagnosis not present

## 2022-08-02 DIAGNOSIS — N979 Female infertility, unspecified: Secondary | ICD-10-CM | POA: Diagnosis not present

## 2022-11-13 IMAGING — US US THYROID
1 series · 14 of 25 positions shown · non-contrast
Comparison: Report only from 04/10/2011

CLINICAL DATA: Goiter.

EXAM:
THYROID ULTRASOUND
TECHNIQUE: Ultrasound examination of the thyroid gland and adjacent soft
tissues was performed.

[Series 1: us thyroid · 0.08mm/px · 14 of 35 slices shown]
[im 1/35]
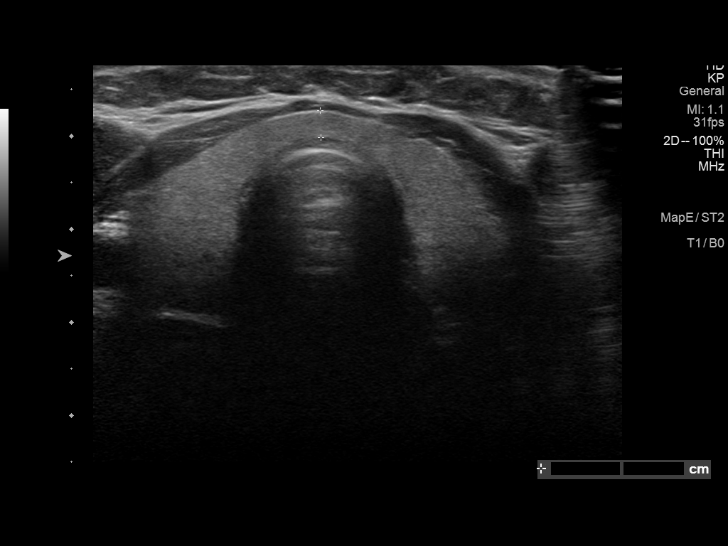
[im 3/35]
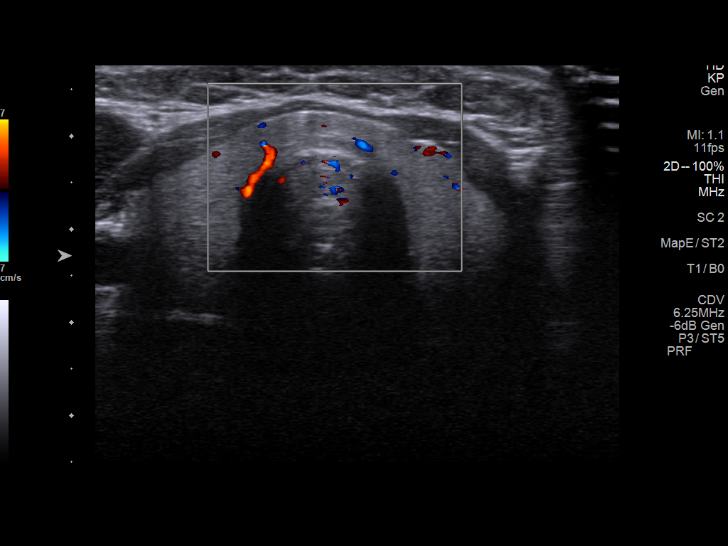
[im 6/35]
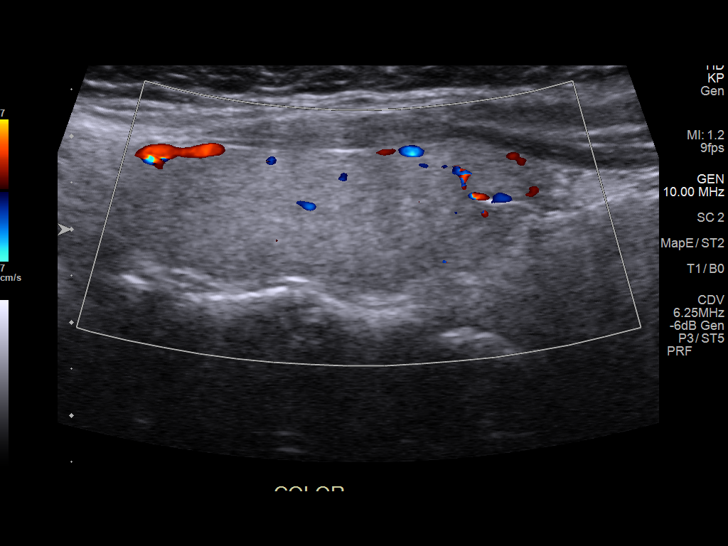
[im 9/35]
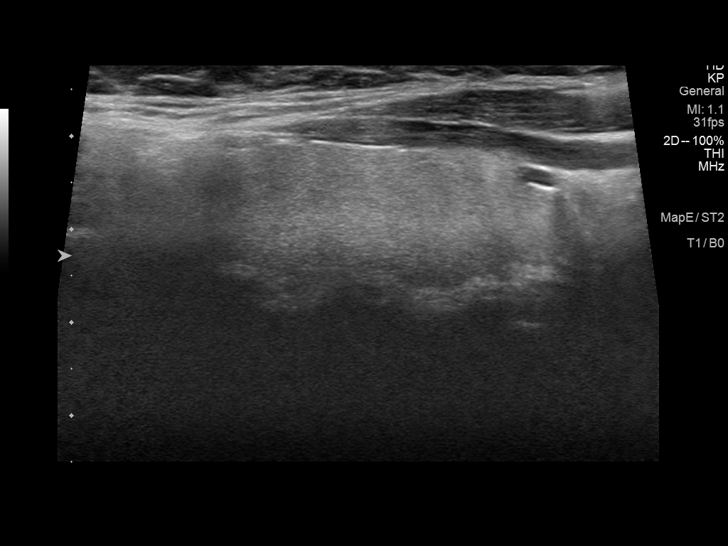
[im 12/35]
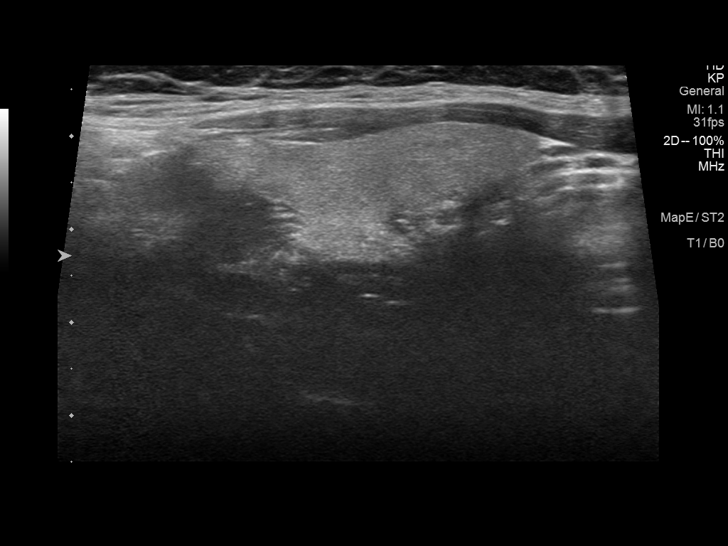
[im 13/35]
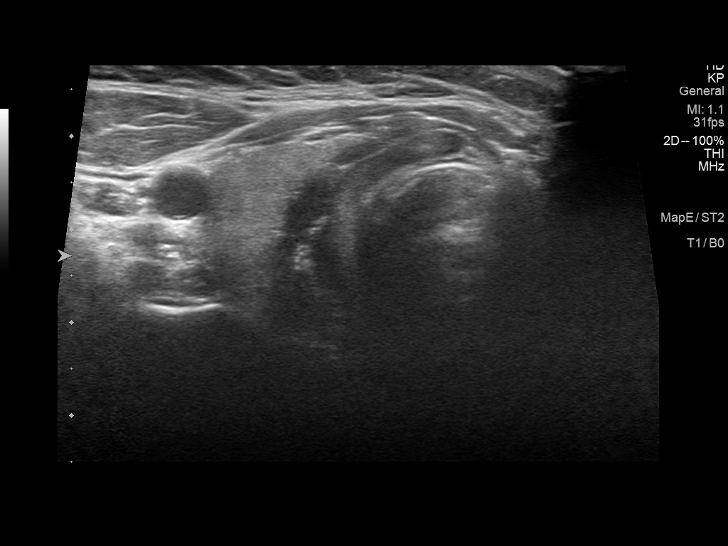
[im 16/35]
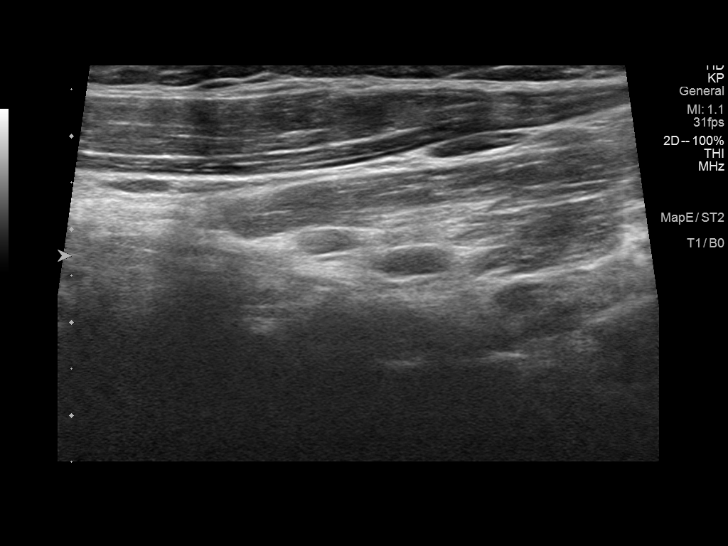
[im 19/35]
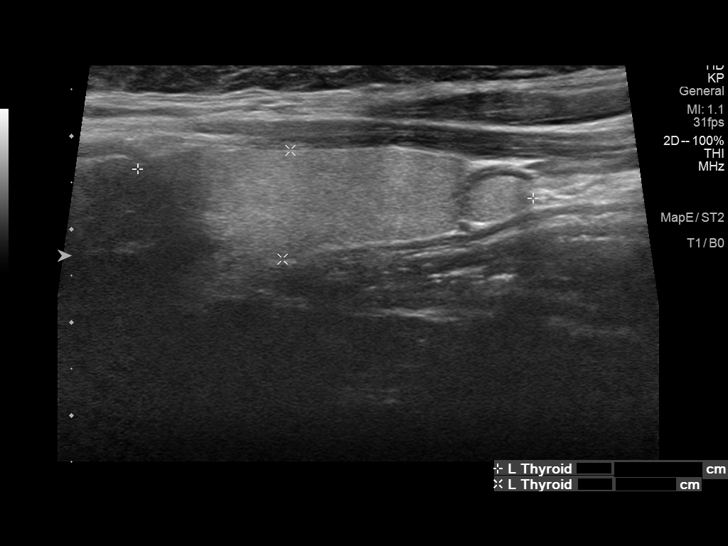
[im 22/35]
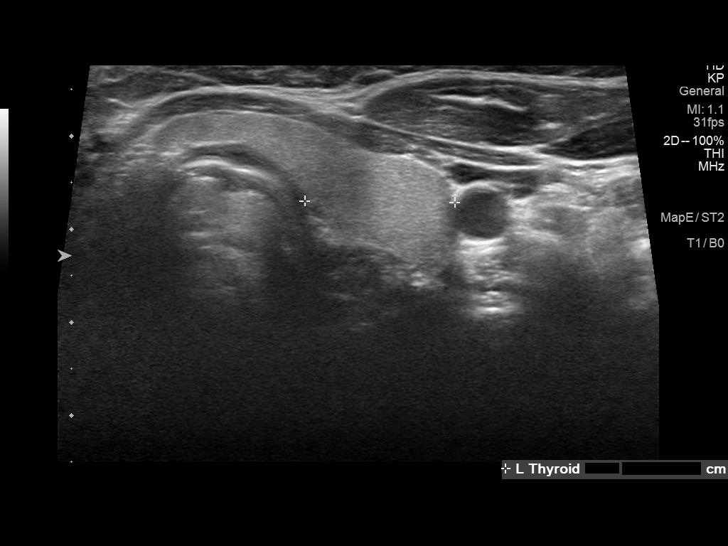
[im 23/35]
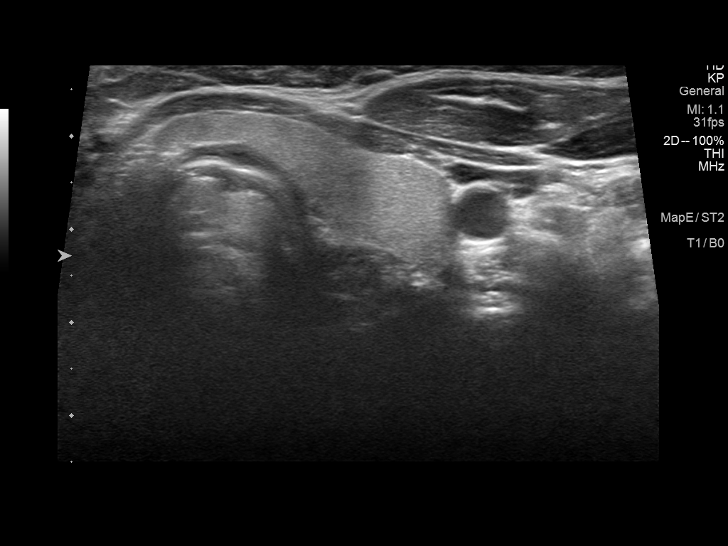
[im 26/35]
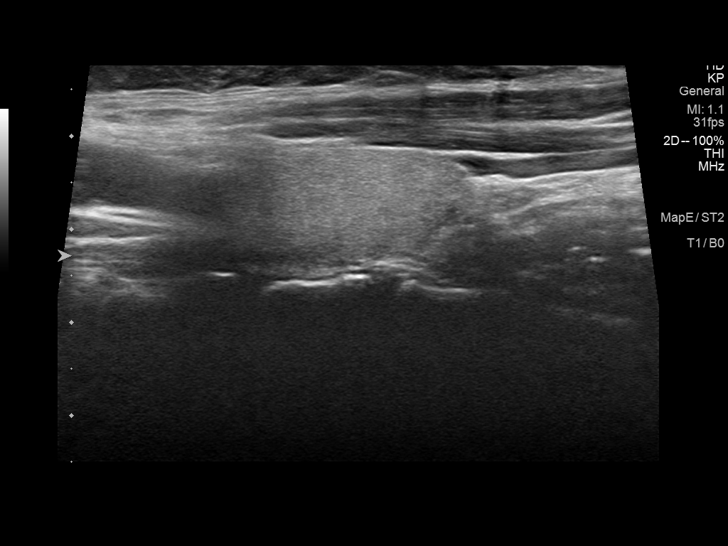
[im 29/35]
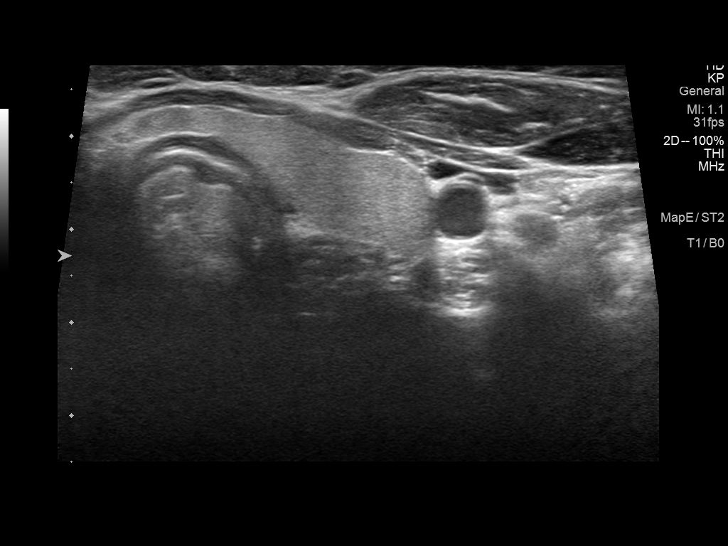
[im 32/35]
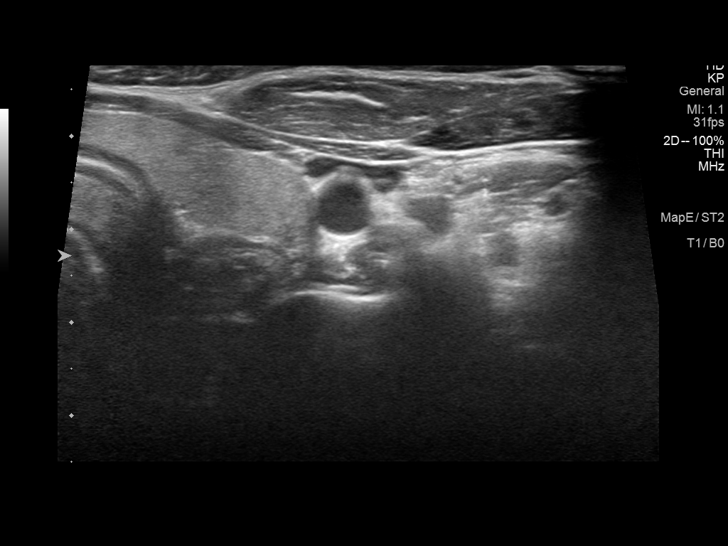
[im 35/35]
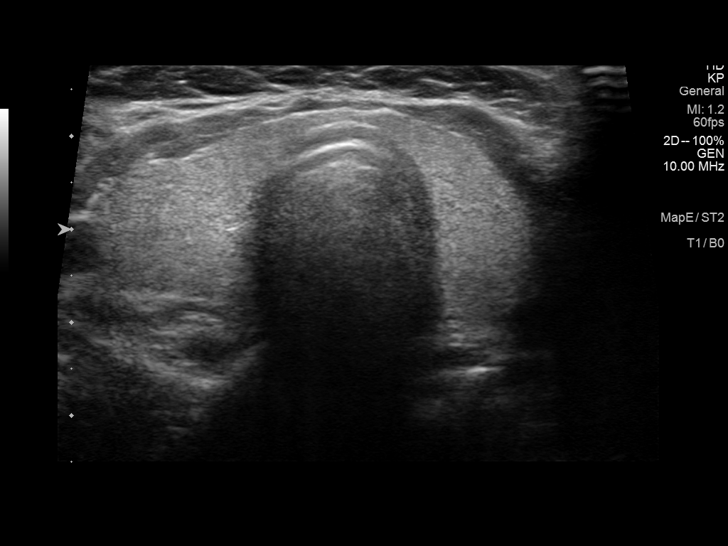

[14 of 25 positions shown; findings below may reference images not displayed]

FINDINGS: Parenchymal Echotexture: Normal

Isthmus: 3 mm

Right lobe: 5.1 x 1.7 x 1.6 cm

Left lobe: 4.3 x 1.2 x 1.6 cm

_________________________________________________________

Estimated total number of nodules >/= 1 cm: 0

Number of spongiform nodules >/=  2 cm not described below (TR1): 0

Number of mixed cystic and solid nodules >/= 1.5 cm not described
below (TR2): 0

_________________________________________________________

No discrete nodules are seen within the thyroid gland.
IMPRESSION: Normal thyroid ultrasound for age

The above is in keeping with the ACR TI-RADS recommendations - [HOSPITAL] 7069;[DATE].

## 2023-01-28 ENCOUNTER — Encounter: Payer: BC Managed Care – PPO | Admitting: Family

## 2023-03-11 ENCOUNTER — Encounter: Payer: BC Managed Care – PPO | Admitting: Family

## 2023-05-16 ENCOUNTER — Telehealth: Payer: Self-pay | Admitting: Family

## 2023-05-16 DIAGNOSIS — Z0000 Encounter for general adult medical examination without abnormal findings: Secondary | ICD-10-CM

## 2023-05-16 NOTE — Telephone Encounter (Signed)
Pt called to schedule her cpe. Her appointment is for 10/25 at 2:20 pm. She would like to have her labs done in the morning that day before her appt. Please advise when labs can be ordered so pt can be scheduled.

## 2023-05-17 NOTE — Telephone Encounter (Signed)
Called patient but no answer, left voice mail for patient to call back.

## 2023-05-17 NOTE — Telephone Encounter (Signed)
I place orders for routine lab panels, however please let pt know that insurance sometimes does not cover screening lab work.

## 2023-05-17 NOTE — Addendum Note (Signed)
Addended by: [name] on: 05/17/2023 08:00 AM   Modules accepted: Orders

## 2023-05-18 NOTE — Telephone Encounter (Signed)
Patient was scheduled for 06/16/23 for labs, at her request

## 2023-06-16 ENCOUNTER — Other Ambulatory Visit (INDEPENDENT_AMBULATORY_CARE_PROVIDER_SITE_OTHER): Payer: BC Managed Care – PPO

## 2023-06-16 DIAGNOSIS — Z0000 Encounter for general adult medical examination without abnormal findings: Secondary | ICD-10-CM | POA: Diagnosis not present

## 2023-06-16 LAB — COMPREHENSIVE METABOLIC PANEL
ALT: 21 U/L (ref 0–35)
AST: 15 U/L (ref 0–37)
Albumin: 3.9 g/dL (ref 3.5–5.2)
Alkaline Phosphatase: 77 U/L (ref 39–117)
BUN: 13 mg/dL (ref 6–23)
CO2: 25 meq/L (ref 19–32)
Calcium: 8.6 mg/dL (ref 8.4–10.5)
Chloride: 107 meq/L (ref 96–112)
Creatinine, Ser: 0.73 mg/dL (ref 0.40–1.20)
GFR: 106.32 mL/min (ref >60.00)
Glucose, Bld: 89 mg/dL (ref 70–99)
Potassium: 4.3 meq/L (ref 3.5–5.1)
Sodium: 138 meq/L (ref 135–145)
Total Bilirubin: 0.4 mg/dL (ref 0.2–1.2)
Total Protein: 6.4 g/dL (ref 6.0–8.3)

## 2023-06-16 LAB — CBC WITH DIFFERENTIAL/PLATELET
Basophils Absolute: 0 10*3/uL (ref 0.0–0.1)
Basophils Relative: 0.5 % (ref 0.0–3.0)
Eosinophils Absolute: 0.1 10*3/uL (ref 0.0–0.7)
Eosinophils Relative: 1 % (ref 0.0–5.0)
HCT: 41.3 % (ref 36.0–46.0)
Hemoglobin: 13.3 g/dL (ref 12.0–15.0)
Lymphocytes Relative: 33.9 % (ref 12.0–46.0)
Lymphs Abs: 2.9 10*3/uL (ref 0.7–4.0)
MCHC: 32.2 g/dL (ref 30.0–36.0)
MCV: 91.3 fL (ref 78.0–100.0)
Monocytes Absolute: 0.7 10*3/uL (ref 0.1–1.0)
Monocytes Relative: 7.7 % (ref 3.0–12.0)
Neutro Abs: 4.9 10*3/uL (ref 1.4–7.7)
Neutrophils Relative %: 56.9 % (ref 43.0–77.0)
Platelets: 292 10*3/uL (ref 150.0–400.0)
RBC: 4.52 Mil/uL (ref 3.87–5.11)
RDW: 12.7 % (ref 11.5–15.5)
WBC: 8.5 10*3/uL (ref 4.0–10.5)

## 2023-06-16 LAB — LIPID PANEL
Cholesterol: 171 mg/dL (ref 0–200)
HDL: 34.1 mg/dL — ABNORMAL LOW (ref >39.00)
LDL Cholesterol: 116 mg/dL — ABNORMAL HIGH (ref 0–99)
NonHDL: 137.22
Total CHOL/HDL Ratio: 5
Triglycerides: 106 mg/dL (ref 0.0–149.0)
VLDL: 21.2 mg/dL (ref 0.0–40.0)

## 2023-06-16 LAB — TSH: TSH: 3.93 u[IU]/mL (ref 0.35–5.50)

## 2023-06-17 ENCOUNTER — Encounter: Payer: Self-pay | Admitting: Family

## 2023-06-17 ENCOUNTER — Ambulatory Visit (INDEPENDENT_AMBULATORY_CARE_PROVIDER_SITE_OTHER): Payer: BC Managed Care – PPO | Admitting: Family

## 2023-06-17 VITALS — BP 111/61 | HR 69 | Temp 98.6°F | Resp 16 | Ht 61.5 in | Wt 182.0 lb

## 2023-06-17 DIAGNOSIS — Z0000 Encounter for general adult medical examination without abnormal findings: Secondary | ICD-10-CM

## 2023-06-17 DIAGNOSIS — Z0001 Encounter for general adult medical examination with abnormal findings: Secondary | ICD-10-CM

## 2023-06-17 DIAGNOSIS — N926 Irregular menstruation, unspecified: Secondary | ICD-10-CM

## 2023-06-17 DIAGNOSIS — Z6833 Body mass index (BMI) 33.0-33.9, adult: Secondary | ICD-10-CM

## 2023-06-17 DIAGNOSIS — M25561 Pain in right knee: Secondary | ICD-10-CM

## 2023-06-17 DIAGNOSIS — E66811 Obesity, class 1: Secondary | ICD-10-CM

## 2023-06-17 DIAGNOSIS — H6120 Impacted cerumen, unspecified ear: Secondary | ICD-10-CM

## 2023-06-17 DIAGNOSIS — M25562 Pain in left knee: Secondary | ICD-10-CM

## 2023-06-17 DIAGNOSIS — R21 Rash and other nonspecific skin eruption: Secondary | ICD-10-CM | POA: Diagnosis not present

## 2023-06-17 DIAGNOSIS — E559 Vitamin D deficiency, unspecified: Secondary | ICD-10-CM

## 2023-06-17 DIAGNOSIS — R350 Frequency of micturition: Secondary | ICD-10-CM | POA: Diagnosis not present

## 2023-06-17 DIAGNOSIS — J45909 Unspecified asthma, uncomplicated: Secondary | ICD-10-CM

## 2023-06-17 MED ORDER — ALBUTEROL SULFATE HFA 108 (90 BASE) MCG/ACT IN AERS: 2.0000 | INHALATION_SPRAY | Freq: Four times a day (QID) | RESPIRATORY_TRACT | 0 refills | Status: DC | PRN

## 2023-06-17 MED ORDER — BUDESONIDE-FORMOTEROL FUMARATE 160-4.5 MCG/ACT IN AERO: 2.0000 | INHALATION_SPRAY | Freq: Two times a day (BID) | RESPIRATORY_TRACT | 6 refills | Status: AC

## 2023-06-17 NOTE — Progress Notes (Unsigned)
Subjective:     Patient ID: Sandra Alvarado, female    DOB: 01-30-1986, 37 y.o.   MRN: [id number]  Chief Complaint  Patient presents with   Annual Exam    HPI  Discussed the use of AI scribe software for clinical note transcription with the patient, who gave verbal consent to proceed.  History of Present Illness   The patient, with a history of asthma, presents for a routine physical. She reports a concern about weight management, expressing difficulty in losing weight compared to a few years ago. She also mentions knee pain, specifically at the back of the knees, which she describes as a soreness similar to over-exercising. This pain has been preventing her from walking for exercise. The patient also reports a recurring rash that starts under the arm and spreads around the armpit, causing significant itching. She does not have rash currently.   The patient has noticed changes in her menstrual cycle, with periods becoming thinner and the cycle shortening from 27 to 26 days. She also reports frequent urination over the past two weeks, with a sensation of not fully emptying the bladder, and some back pain. The patient mentions a history of urinary tract infections and kidney infections.  The patient has been experiencing some constipation, but notes an improvement recently, with bowel movements occurring three to four times a week. She also reports some hair thinning at the points of her eyebrows. The patient is a former smoker and quit earlier this year.      Immunizations: declines flu shot.  Diet:  fair Wt Readings from Last 3 Encounters:  06/17/23 182 lb (82.6 kg)  01/25/22 178 lb (80.7 kg)  10/16/21 178 lb (80.7 kg)  Exercise: limited due to knee pain Pap Smear: up to date with GYN- will request report.   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   History of chicken pox    History of herpes simplex infection    Irregular periods/menstrual cycles     Past Surgical History:  Procedure Laterality Date   COLPOSCOPY  01/17/09   DILATION AND EVACUATION N/A 03/21/2015   Procedure: DILATATION AND EVACUATION Ultrasound Guided;  Surgeon: Jaymes Graff, MD;  Location: [facility];  Service: Gynecology;  Laterality: N/A;   WISDOM TOOTH EXTRACTION      Family History  Problem Relation Age of Onset   Thyroid disease Mother    Diabetes Mother        type II   Hypertension Father    Hyperlipidemia Father    Hypertension Maternal Grandmother    Thyroid disease Maternal Grandmother    Arthritis Maternal Grandmother    Depression Maternal Grandmother    Alzheimer's disease Maternal Grandmother    Alcohol abuse Maternal Grandfather    Hyperlipidemia Paternal Grandmother    Alcohol abuse Paternal Grandfather     Social History   Socioeconomic History   Marital status:  Married    Spouse name: Not on file   Number of children: Not on file   Years of education: Not on file   Highest education level: Not on file  Occupational History   Not on file  Tobacco Use   Smoking status: Former    Current packs/day: 0.00    Types: Cigarettes    Quit date: 01/13/2015    Years since quitting: 8.4   Smokeless tobacco: Never  Vaping Use   Vaping status: Never Used  Substance and Sexual Activity   Alcohol use: Yes    Comment: rare   Drug use: No   Sexual activity: Yes    Birth control/protection: None  Other Topics Concern   Not on file  Social History Narrative   Lives with her daughter (2008) and  her husband.     Works as a Network engineer at [employer]   Completed associates degree, some bachelors    Enjoys sleeping/reading, movies, eating and  spending time with daughter   Has a Boston Terrier   Born in [place], raised in [place], moved here for 11 years   Social Determinants of Corporate investment banker Strain: Not on [employer] Insecurity: Not on file  Transportation Needs: Not on file  Physical Activity: Not on file  Stress: Not on file  Social Connections: Not on file  Intimate Partner Violence: Not on file    Outpatient Medications Prior to Visit  Medication Sig Dispense Refill   Spacer/Aero-Holding [employer] Use with inhaler 1 each 1   albuterol (VENTOLIN HFA) 108 (90 Base) MCG/ACT inhaler Inhale 2 puffs into the lungs every 6 (six) hours as needed. (Patient not taking: Reported on 06/17/2023) 18 g 0   Albuterol Sulfate 108 (90 Base) MCG/ACT AEPB Inhale 2 puffs into the lungs every 6 (six) hours as needed. (Patient not taking: Reported on 06/17/2023) 1 each 1   budesonide-formoterol (SYMBICORT) 160-4.5 MCG/ACT inhaler Inhale 2 puffs into the lungs 2 (two) times daily. (Patient not taking: Reported on 06/17/2023) 3 each 6   No facility-administered medications prior to visit.    No Known Allergies  Review of Systems  Constitutional:  Negative for weight loss.  HENT:  Negative for congestion and hearing loss.   Eyes:  Negative for blurred vision.  Respiratory:  Negative for cough.   Cardiovascular:  Negative for leg swelling.  Gastrointestinal:  Positive for constipation (occasional). Negative for diarrhea.  Genitourinary:  Positive for frequency. Negative for dysuria.  Musculoskeletal:  Positive for joint pain (bilateral knees). Negative for myalgias.  Skin:  Negative for rash.  Neurological:  Negative for headaches.  Psychiatric/Behavioral:         Stable mood.         Objective:    Physical Exam   BP 111/61 (BP Location: Right Arm, Patient  Position: Sitting, Cuff Size: Small)   Pulse 69   Temp 98.6 F (37 C) (Oral)   Resp 16   Ht 5' 1.5" (1.562 m)   Wt 182 lb (82.6 kg)   SpO2 98%   BMI 33.83 kg/m  Wt Readings from Last 3 Encounters:  06/17/23 182 lb (82.6 kg)  01/25/22 178 lb (80.7 kg)  10/16/21 178 lb (80.7 kg)      Physical Exam  Constitutional: She is oriented to person, place, and time. She appears well-developed and well-nourished. No distress.  HENT:  Head: Normocephalic and atraumatic.  Right Ear: cerumen impaction Left Ear: cerumen impaction Mouth/Throat: Oropharynx  is clear and moist.  Eyes: Pupils are equal, round, and reactive to light. No scleral icterus.  Neck: Normal range of motion. No thyromegaly present.  Cardiovascular: Normal rate and regular rhythm.   No murmur heard. Pulmonary/Chest: Effort normal and breath sounds normal. No respiratory distress. He has no wheezes. She has no rales. She exhibits no tenderness.  Abdominal: Soft. Bowel sounds are normal. She exhibits no distension and no mass. There is no tenderness. There is no rebound and no guarding.  Musculoskeletal: She exhibits no edema.  Lymphadenopathy:    She has no cervical adenopathy.  Neurological: She is alert and oriented to person, place, and time. She has normal patellar reflexes. She exhibits normal muscle tone. Coordination normal.  Skin: Skin is warm and dry.  Psychiatric: She has a normal mood and affect. Her behavior is normal. Judgment and thought content normal.  Breast/pelvic: deferred to GYN           Assessment & Plan:    Assessment & Plan:   Problem List Items Addressed This Visit       Unprioritized   Vitamin D deficiency    Update vitamin D level.  Not currently on supplement.       Relevant Orders   VITAMIN D 25 Hydroxy (Vit-D Deficiency, Fractures) (Completed)   Urinary frequency    Obtain urine culture to rule out UTI.      Relevant Orders   Urine Culture (Completed)   Uncomplicated  asthma    Stable on symbicort and albuterol. Continue same.      Relevant Medications   budesonide-formoterol (SYMBICORT) 160-4.5 MCG/ACT inhaler   albuterol (VENTOLIN HFA) 108 (90 Base) MCG/ACT inhaler   Skin rash     Patient reports recurrent itchy rash in the axillary region, possibly fungal. -Recommend over-the-counter Lotrimin antifungal cream for use when rash flares.      Preventative health care     -Encourage patient to receive COVID-19 vaccination at local pharmacy. -Declined flu shot today. -Annual GYN appointment and Pap smear scheduled for December.      Pain in both knees - Primary    Uncontrolled. Refer to sports medicine.       Relevant Orders   Ambulatory referral to Sports Medicine   Obesity (BMI 30.0-34.9)    It does not look like her insurance covers GLP-1's.  [facility]       Relevant Orders   Amb Ref to Medical Weight Management   Menstrual changes     Patient reports changes in menstrual cycle, with periods becoming lighter and shorter. No missed periods. -No immediate intervention needed. Normal variation in menstrual cycle.       Excessive cerumen in ear canal     Ear Wax Buildup Patient reports feeling of fullness in ear, likely due to wax buildup. -Recommend over-the-counter ear wax drops for use at home.       I have discontinued Sharena L. Klann's Albuterol Sulfate. I am also having her maintain her Spacer/Aero-Holding Chambers, budesonide-formoterol, and albuterol.  Meds ordered this encounter  Medications   budesonide-formoterol (SYMBICORT) 160-4.5 MCG/ACT inhaler    Sig: Inhale 2 puffs into the lungs 2 (two) times daily.    Dispense:  3 each    Refill:  6    Order Specific Question:   Supervising Provider    Answer:   Danise Edge A [[id number]]   albuterol (VENTOLIN HFA) 108 (90 Base) MCG/ACT inhaler    Sig:  Inhale 2 puffs into the lungs every 6 (six) hours as needed.    Dispense:  18 g     Refill:  0    Order Specific Question:   Supervising Provider    Answer:   Danise Edge A [[id number]]

## 2023-06-18 LAB — VITAMIN D 25 HYDROXY (VIT D DEFICIENCY, FRACTURES): Vit D, 25-Hydroxy: 16 ng/mL — ABNORMAL LOW (ref 30–100)

## 2023-06-18 LAB — URINE CULTURE
MICRO NUMBER:: 15644529
SPECIMEN QUALITY:: ADEQUATE

## 2023-06-19 ENCOUNTER — Telehealth: Payer: Self-pay | Admitting: Family

## 2023-06-19 DIAGNOSIS — R21 Rash and other nonspecific skin eruption: Secondary | ICD-10-CM | POA: Insufficient documentation

## 2023-06-19 DIAGNOSIS — J45909 Unspecified asthma, uncomplicated: Secondary | ICD-10-CM | POA: Insufficient documentation

## 2023-06-19 DIAGNOSIS — E559 Vitamin D deficiency, unspecified: Secondary | ICD-10-CM

## 2023-06-19 DIAGNOSIS — E66811 Obesity, class 1: Secondary | ICD-10-CM | POA: Insufficient documentation

## 2023-06-19 DIAGNOSIS — R350 Frequency of micturition: Secondary | ICD-10-CM | POA: Insufficient documentation

## 2023-06-19 DIAGNOSIS — M25561 Pain in right knee: Secondary | ICD-10-CM | POA: Insufficient documentation

## 2023-06-19 MED ORDER — VITAMIN D (ERGOCALCIFEROL) 1.25 MG (50000 UNIT) PO CAPS: 50000.0000 [IU] | ORAL_CAPSULE | ORAL | 0 refills | Status: DC

## 2023-06-19 NOTE — Assessment & Plan Note (Signed)
Obtain urine culture to rule out UTI.

## 2023-06-19 NOTE — Patient Instructions (Signed)
VISIT SUMMARY:  During your visit today, we discussed several health concerns including knee pain, weight management, asthma, a recurring skin rash, urinary frequency, ear wax buildup, changes in your menstrual cycle, and a history of vitamin D deficiency. We have created a plan to address each of these issues and ensure your overall well-being.  YOUR PLAN:  -KNEE PAIN: You have been experiencing pain at the back of your knee, which may be due to a Baker's cyst or early arthritis. We are referring you to Sports Medicine for further evaluation and management.  -WEIGHT MANAGEMENT: You expressed difficulty in losing weight. We discussed potential medication options and are referring you to the [facility] in [place] for a comprehensive weight management program.  -ASTHMA: Your asthma symptoms have improved, even after running out of your medication. We have refilled your Symbicort and Albuterol inhaler prescriptions to help manage your asthma.  -SKIN RASH: You have a recurring itchy rash under your arm, which may be fungal. We recommend using over-the-counter Lotrimin antifungal cream when the rash flares up.  -URINARY FREQUENCY AND BACK PAIN: You have been experiencing increased urinary frequency and lower back pain. We have ordered a urine culture to rule out a urinary tract infection.  -EAR WAX BUILDUP: You feel fullness in your ear, likely due to ear wax buildup. We recommend using over-the-counter ear wax drops at home.  -MENSTRUAL CHANGES: Your menstrual cycle has become lighter and shorter, which can be a normal variation. No immediate intervention is needed at this time.  -VITAMIN D DEFICIENCY: You have a history of low vitamin D levels and ongoing fatigue. We have ordered a Vitamin D level test and will consider supplementation if your levels are low.  -GENERAL HEALTH MAINTENANCE: We encourage you to receive the COVID-19 vaccination at your local pharmacy. You  have declined the flu shot today. Your annual GYN appointment and Pap smear are scheduled for December.  INSTRUCTIONS:  Please follow up with the referrals to Sports Medicine and the [facility]. Use the prescribed medications and over-the-counter treatments as discussed. We will contact you with the results of your urine culture and Vitamin D level test. Your annual GYN appointment and Pap smear are scheduled for December.

## 2023-06-19 NOTE — Assessment & Plan Note (Signed)
  Ear Wax Buildup Patient reports feeling of fullness in ear, likely due to wax buildup. -Recommend over-the-counter ear wax drops for use at home.

## 2023-06-19 NOTE — Assessment & Plan Note (Signed)
Stable on symbicort and albuterol. Continue same.

## 2023-06-19 NOTE — Assessment & Plan Note (Signed)
  Patient reports recurrent itchy rash in the axillary region, possibly fungal. -Recommend over-the-counter Lotrimin antifungal cream for use when rash flares.

## 2023-06-19 NOTE — Assessment & Plan Note (Signed)
Update vitamin D level.  Not currently on supplement.

## 2023-06-19 NOTE — Assessment & Plan Note (Signed)
It does not look like her insurance covers GLP-1's.  Will refer to [facility].

## 2023-06-19 NOTE — Assessment & Plan Note (Signed)
-  Encourage patient to receive COVID-19 vaccination at local pharmacy. -Declined flu shot today. -Annual GYN appointment and Pap smear scheduled for December.

## 2023-06-19 NOTE — Assessment & Plan Note (Signed)
  Patient reports changes in menstrual cycle, with periods becoming lighter and shorter. No missed periods. -No immediate intervention needed. Normal variation in menstrual cycle.

## 2023-06-19 NOTE — Telephone Encounter (Signed)
Vitamin D level is low.  Advise patient to begin vit D 50000 units once weekly for 12 weeks, then repeat vit D level (dx Vit D deficiency).     

## 2023-06-19 NOTE — Assessment & Plan Note (Signed)
Uncontrolled. Refer to sports medicine.

## 2023-06-20 NOTE — Telephone Encounter (Signed)
Patient notified of results and scheduled to return in January for labs

## 2023-07-15 ENCOUNTER — Other Ambulatory Visit: Payer: Self-pay

## 2023-07-15 ENCOUNTER — Telehealth: Payer: Self-pay | Admitting: Family

## 2023-07-15 DIAGNOSIS — E559 Vitamin D deficiency, unspecified: Secondary | ICD-10-CM

## 2023-07-15 MED ORDER — VITAMIN D (ERGOCALCIFEROL) 1.25 MG (50000 UNIT) PO CAPS: 50000.0000 [IU] | ORAL_CAPSULE | ORAL | 0 refills | Status: DC

## 2023-07-15 NOTE — Telephone Encounter (Signed)
Pt called to follow up on Rx status for Vitamin D. After reviewing chart, asked pt which pharmacy was the Rx supposed to be sent to. Pt advised the following pharmacy was supposed to receive it:  [id number] - [place], [place] - [address] [place]  [address] Phone: [phone] Fax: [phone]  Per chart, Rx was sent to the wrong pharmacy and needs to be rerouted to the correct pharmacy. Listed prior.

## 2023-07-15 NOTE — Telephone Encounter (Signed)
Rx sent to requested pharmacy

## 2023-08-01 ENCOUNTER — Ambulatory Visit (INDEPENDENT_AMBULATORY_CARE_PROVIDER_SITE_OTHER): Payer: BC Managed Care – PPO | Admitting: Family Medicine

## 2023-08-01 ENCOUNTER — Other Ambulatory Visit: Payer: Self-pay | Admitting: Family Medicine

## 2023-08-01 ENCOUNTER — Ambulatory Visit (HOSPITAL_BASED_OUTPATIENT_CLINIC_OR_DEPARTMENT_OTHER)
Admission: RE | Admit: 2023-08-01 | Discharge: 2023-08-01 | Disposition: A | Discharge status: Home / Self Care | Payer: BC Managed Care – PPO | Source: Ambulatory Visit | Attending: Family Medicine | Admitting: Family Medicine

## 2023-08-01 VITALS — BP 110/73 | Ht 61.5 in | Wt 182.0 lb

## 2023-08-01 DIAGNOSIS — M25561 Pain in right knee: Secondary | ICD-10-CM | POA: Diagnosis not present

## 2023-08-01 DIAGNOSIS — G8929 Other chronic pain: Secondary | ICD-10-CM | POA: Insufficient documentation

## 2023-08-01 DIAGNOSIS — M25562 Pain in left knee: Secondary | ICD-10-CM | POA: Insufficient documentation

## 2023-08-01 NOTE — Progress Notes (Unsigned)
CHIEF COMPLAINT: No chief complaint on file.  _____________________________________________________________ SUBJECTIVE  HPI  Pt is a 37 y.o. female here for evaluation of Bilateral knee pain  Seen in PCP office 06/17/2023, when she mentioned knee pain.  This note was reviewed: -Posterior knee pain, similar to the feeling of soreness from over exercising -Was also evaluated for concerns for weight management, she shared pain preventing ability to walk or exercise -Positive family history for arthritis  Overall has been ongoing for 4 years  Over last year has been getting worse  Thinks weight have something to do with it  Every once in a while would have pain on both sides of her knee; initially started medially and then bilaterally. Started with L leg, then the R started to bother her. Dull pain. Pain comes and goes. Later on started to have infrapatellar pain both knees, never at the same time, and then later on would have suprapatellar pain. Now recently pain behind her knees. Feels like something is overstretched/soreness from working out  R pain is worse than L  Feels like this pain came out of nowhere  Pain was constant for a few days regardless of activity, also felt at rest   Went away for about 2 weeks after her PCP visit   Started up again yesterday  Non radiating, localized to posterior knee  Thinks it's swollen  No numbness or tingling, no maneuvers seem to exacerbate this  Notes when she sits on toilet has suprapatellar and lateral kneecap pain bilaterally  Therapies tried: none so far  No films found in chart  ------------------------------------------------------------------------------------------------------ Past Medical History:  Diagnosis Date   Abnormal Pap smear 09/2012   Colposcopy = CIN-I   Acid reflux    ASCUS (atypical squamous cells of undetermined significance) on Pap smear 2008   BV (bacterial vaginosis) 10/02/07   Condyloma 2010   GBS carrier    H/O  candidiasis    H/O nausea 10/2006   H/O varicella    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   budesonide-formoterol (SYMBICORT) 160-4.5 MCG/ACT inhaler Inhale 2 puffs into the lungs 2 (two) times daily.   Spacer/Aero-Holding Rudean Curt Use with inhaler   Vitamin D, Ergocalciferol, (DRISDOL) 1.25 MG (50000 UNIT) CAPS capsule Take 1 capsule (50,000 Units total) by mouth every 7 (seven) days.   No facility-administered encounter medications on file as of 08/01/2023.    ------------------------------------------------------------------------------------------------------  _____________________________________________________________ OBJECTIVE  PHYSICAL EXAM  Today's Vitals   08/01/23 1611  BP: 110/73  Weight: 182 lb (82.6 kg)  Height: 5' 1.5" (1.562 m)   Body mass index is 33.83 kg/m.   Reviewed   General: A+Ox3, no acute distress, well-nourished, appropriate affect CV: pulses 2+ regular, nondiaphoretic, no peripheral edema, cap refill <2sec Lungs: no audible wheezing, non-labored breathing, bilateral chest rise/fall, nontachypneic Skin: warm, well-perfused, non-icteric, no susp lesions or rashes Neuro:  No focal deficits.  Sensation intact, muscle tone wnl, no atrophy Psych: no signs of depression or anxiety MSK: ***    *** Knee: No swelling or deformity Neg fluid wave, joint milking ROM Flex {Numbers;  0-100:15068}, Ext {NUMBERS; -10-45 JOINT ROM:10287} NTTP over the quad tendon, medial fem condyle, lat fem condyle, patella, plica, patella tendon, tibial tuberostiy, fibular head, posterior fossa, pes anserine bursa, gerdy's tubercle, medial jt line, lateral jt line Neg anterior and posterior drawer Neg lachman Neg sag sign Negative varus stress Negative valgus stress Negative McMurray Negative Thessaly  Gait {GAIT:23234}   _____________________________________________________________ ASSESSMENT/PLAN Diagnoses and all orders for this visit:  Bilateral chronic knee pain -     DG Knee 3 Views Left; Future -     DG Knee AP/LAT W/Sunrise Right; Future -     Ambulatory referral to Physical Therapy   Limited POCUS completed:  Summary: ***  Differential Diagnosis:  Treatment:   No follow-ups on file.  Electronically signed by: Burna Forts, MD 08/01/2023 4:50 PM

## 2023-09-15 ENCOUNTER — Other Ambulatory Visit (INDEPENDENT_AMBULATORY_CARE_PROVIDER_SITE_OTHER): Payer: BC Managed Care – PPO

## 2023-09-15 DIAGNOSIS — E559 Vitamin D deficiency, unspecified: Secondary | ICD-10-CM | POA: Diagnosis not present

## 2023-09-16 ENCOUNTER — Other Ambulatory Visit: Payer: BC Managed Care – PPO

## 2023-09-16 LAB — VITAMIN D 25 HYDROXY (VIT D DEFICIENCY, FRACTURES): VITD: 19.7 ng/mL — ABNORMAL LOW (ref 30.00–100.00)

## 2023-09-18 ENCOUNTER — Encounter: Payer: Self-pay | Admitting: Family

## 2023-09-27 ENCOUNTER — Ambulatory Visit: Payer: BC Managed Care – PPO | Admitting: Physical Therapy

## 2023-09-29 DIAGNOSIS — Z113 Encounter for screening for infections with a predominantly sexual mode of transmission: Secondary | ICD-10-CM | POA: Diagnosis not present

## 2023-09-29 DIAGNOSIS — R87610 Atypical squamous cells of undetermined significance on cytologic smear of cervix (ASC-US): Secondary | ICD-10-CM | POA: Diagnosis not present

## 2023-09-29 DIAGNOSIS — N979 Female infertility, unspecified: Secondary | ICD-10-CM | POA: Diagnosis not present

## 2023-09-29 DIAGNOSIS — Z01419 Encounter for gynecological examination (general) (routine) without abnormal findings: Secondary | ICD-10-CM | POA: Diagnosis not present

## 2023-10-18 ENCOUNTER — Encounter: Payer: BC Managed Care – PPO | Admitting: Bariatrics

## 2023-10-19 DIAGNOSIS — N979 Female infertility, unspecified: Secondary | ICD-10-CM | POA: Diagnosis not present

## 2023-10-21 ENCOUNTER — Other Ambulatory Visit: Payer: Self-pay | Admitting: Family

## 2023-10-21 DIAGNOSIS — E559 Vitamin D deficiency, unspecified: Secondary | ICD-10-CM

## 2023-10-25 ENCOUNTER — Encounter: Payer: Self-pay | Admitting: Bariatrics

## 2023-10-25 ENCOUNTER — Ambulatory Visit (INDEPENDENT_AMBULATORY_CARE_PROVIDER_SITE_OTHER): Payer: BC Managed Care – PPO | Admitting: Bariatrics

## 2023-10-25 VITALS — BP 102/67 | HR 78 | Temp 98.4°F | Ht 61.5 in | Wt 184.0 lb

## 2023-10-25 DIAGNOSIS — M25561 Pain in right knee: Secondary | ICD-10-CM | POA: Diagnosis not present

## 2023-10-25 DIAGNOSIS — E559 Vitamin D deficiency, unspecified: Secondary | ICD-10-CM | POA: Diagnosis not present

## 2023-10-25 DIAGNOSIS — E669 Obesity, unspecified: Secondary | ICD-10-CM

## 2023-10-25 DIAGNOSIS — E6609 Other obesity due to excess calories: Secondary | ICD-10-CM

## 2023-10-25 DIAGNOSIS — M25562 Pain in left knee: Secondary | ICD-10-CM

## 2023-10-25 DIAGNOSIS — Z6834 Body mass index (BMI) 34.0-34.9, adult: Secondary | ICD-10-CM

## 2023-10-25 NOTE — Progress Notes (Signed)
 Office: [phone]  /  Fax: [phone]   Initial Visit  Sandra Alvarado was seen in clinic today to evaluate for obesity. She is interested in losing weight to improve overall health and reduce the risk of weight related complications. She presents today to review program treatment options, initial physical assessment, and evaluation.     She was referred by: PCP  When asked what else they would like to accomplish? She states: Adopt healthier eating patterns, Improve energy levels and physical activity, Improve existing medical conditions, and Improve quality of life  When asked how has your weight affected you? She states: Contributed to orthopedic problems or mobility issues, Having fatigue, and Having poor endurance  Some associated conditions: Vitamin D Deficiency and Other: bilateral knee pain  Contributing factors: Family history of obesity, Moderate to high levels of stress, and Slow metabolism for age  Weight promoting medications identified: None  Current nutrition plan: Portion control / smart choices and Other: stopped soda and eating out.   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  Peak Weight:187 lbs , Body Fat%:45.1%, Visceral Fat Rating:10, Weight trend over the last 12 months: Unchanged  General:  Alert, oriented and cooperative. Patient is in no acute distress.  Respiratory: Normal respiratory effort, no problems with respiration noted  Extremities: Normal range of motion.    Mental Status: Normal mood and affect. Normal behavior. Normal judgment and thought content.   DIAGNOSTIC DATA REVIEWED:  BMET    Component Value Date/Time   NA 138 06/16/2023 0927   K 4.3 06/16/2023 0927   CL 107 06/16/2023 0927   CO2 25 06/16/2023 0927   GLUCOSE 89 06/16/2023 0927   BUN 13 06/16/2023 0927   CREATININE 0.73 06/16/2023 0927   CREATININE 0.72 05/11/2019 1512   CALCIUM 8.6 06/16/2023 0927   No results found for: "HGBA1C" No results found for: "INSULIN" CBC    Component Value Date/Time   WBC 8.5 06/16/2023 0927   RBC 4.52 06/16/2023 0927   HGB 13.3 06/16/2023 0927   HCT 41.3 06/16/2023 0927   PLT 292.0 06/16/2023 0927   MCV 91.3 06/16/2023 0927   MCH 29.8 05/11/2019 1512   MCHC 32.2 06/16/2023 0927   RDW 12.7 06/16/2023 0927   Iron/TIBC/Ferritin/ %Sat No results found for: "IRON", "TIBC", "FERRITIN", "IRONPCTSAT" Lipid Panel     Component Value Date/Time   CHOL 171  06/16/2023 0927   TRIG 106.0 06/16/2023 0927   HDL 34.10 (L) 06/16/2023 0927   CHOLHDL 5 06/16/2023 0927   VLDL 21.2 06/16/2023 0927   LDLCALC 116 (H) 06/16/2023 0927   LDLCALC 114 (H) 05/11/2019 1512   Hepatic Function Panel     Component Value Date/Time   PROT 6.4 06/16/2023 0927   ALBUMIN 3.9 06/16/2023 0927   AST 15 06/16/2023 0927   ALT 21 06/16/2023 0927   ALKPHOS 77 06/16/2023 0927   BILITOT 0.4 06/16/2023 0927   BILIDIR 0.1 05/11/2019 1512   IBILI 0.5 05/11/2019 1512      Component Value Date/Time   TSH 3.93 06/16/2023 0927     Assessment and Plan:    Vitamin D Deficiency She is at risk for vitamin D deficiency due to weight.  She is on  prescription ergocalciferol 50,000 IU weekly. Lab Results  Component Value Date   VD25OH 19.70 (L) 09/15/2023   VD25OH 16 (L) 06/17/2023    Plan: Continue prescription vitamin D 50,000 IU weekly.   Pain in both knees:   She states that her knee pain is improved when she loses weight.  She does not take medication.    Plan: She will schedule with our office .  She will continue to walk as tolerated.  She will try the [facility] app.    Generalized Obesity: Current BMI 34.2    Obesity Treatment / Action Plan:  Patient will work on garnering support from family and friends to begin weight loss journey. Will work on eliminating or reducing the presence of highly palatable, calorie dense foods in the home. Will complete provided nutritional and psychosocial assessment questionnaire before the next appointment. Will be scheduled for indirect calorimetry to determine resting energy expenditure in a fasting state.  This will allow [place] to create a reduced calorie, high-protein meal plan to promote loss of fat mass while preserving muscle mass. Counseled on the health benefits of losing 5%-15% of total body weight. Was counseled on nutritional approaches to weight loss and benefits of reducing processed foods and consuming plant-based foods and high quality protein as part of nutritional weight management. Was counseled on pharmacotherapy and role as an adjunct in weight management.   Obesity Education Performed Today:  She was weighed on the bioimpedance scale and results were discussed and documented in the synopsis.  We discussed obesity as a disease and the importance of a more detailed evaluation of all the factors contributing to the disease.  We discussed the importance of long term lifestyle changes which include nutrition, exercise and behavioral modifications as well as the importance of  customizing this to her specific health and social needs.  We discussed the benefits of reaching a healthier weight to alleviate the symptoms of existing conditions and reduce the risks of the biomechanical, metabolic and psychological effects of obesity.  Discussed New Patient/Late Arrival, and Cancellation Policies. Patient voiced understanding and allowed to ask questions.   Sandra Alvarado appears to be in the action stage of change and states they are ready to start intensive lifestyle modifications and behavioral modifications.  30 minutes was spent today on this visit including the above counseling, pre-visit chart review, and post-visit documentation.  Reviewed by clinician on day of visit: allergies, medications, problem list, medical history, surgical history, family history, social history, and previous encounter notes.    Masayo Fera A. Lorretta HarpO.

## 2023-11-21 DIAGNOSIS — N979 Female infertility, unspecified: Secondary | ICD-10-CM | POA: Diagnosis not present

## 2023-12-05 DIAGNOSIS — N979 Female infertility, unspecified: Secondary | ICD-10-CM | POA: Diagnosis not present

## 2023-12-28 ENCOUNTER — Other Ambulatory Visit: Payer: Self-pay | Admitting: Family

## 2023-12-28 DIAGNOSIS — E559 Vitamin D deficiency, unspecified: Secondary | ICD-10-CM

## 2023-12-29 NOTE — Telephone Encounter (Signed)
 Please advise pt to schedule follow up for vit D level and then we can decide best dose of vit D moving forward.

## 2023-12-29 NOTE — Telephone Encounter (Signed)
 Patient scheduled for Vitamin D  check 01/04/24

## 2024-01-04 ENCOUNTER — Other Ambulatory Visit (INDEPENDENT_AMBULATORY_CARE_PROVIDER_SITE_OTHER)

## 2024-01-04 DIAGNOSIS — E559 Vitamin D deficiency, unspecified: Secondary | ICD-10-CM

## 2024-01-05 ENCOUNTER — Telehealth: Payer: Self-pay | Admitting: Family

## 2024-01-05 ENCOUNTER — Other Ambulatory Visit: Payer: Self-pay | Admitting: Family

## 2024-01-05 DIAGNOSIS — E559 Vitamin D deficiency, unspecified: Secondary | ICD-10-CM

## 2024-01-05 DIAGNOSIS — Z0289 Encounter for other administrative examinations: Secondary | ICD-10-CM

## 2024-01-05 LAB — VITAMIN D 25 HYDROXY (VIT D DEFICIENCY, FRACTURES): VITD: 26.73 ng/mL — ABNORMAL LOW (ref 30.00–100.00)

## 2024-01-05 MED ORDER — VITAMIN D (ERGOCALCIFEROL) 1.25 MG (50000 UNIT) PO CAPS: 50000.0000 [IU] | ORAL_CAPSULE | ORAL | 0 refills | Status: DC

## 2024-01-05 NOTE — Telephone Encounter (Signed)
 See mychart.

## 2024-01-13 ENCOUNTER — Other Ambulatory Visit: Payer: Self-pay | Admitting: Family

## 2024-01-13 DIAGNOSIS — E559 Vitamin D deficiency, unspecified: Secondary | ICD-10-CM

## 2024-01-18 DIAGNOSIS — N979 Female infertility, unspecified: Secondary | ICD-10-CM | POA: Diagnosis not present

## 2024-01-18 DIAGNOSIS — N83209 Unspecified ovarian cyst, unspecified side: Secondary | ICD-10-CM | POA: Diagnosis not present

## 2024-01-24 ENCOUNTER — Telehealth: Payer: Self-pay

## 2024-01-24 DIAGNOSIS — E559 Vitamin D deficiency, unspecified: Secondary | ICD-10-CM

## 2024-01-24 MED ORDER — VITAMIN D (ERGOCALCIFEROL) 1.25 MG (50000 UNIT) PO CAPS: 50000.0000 [IU] | ORAL_CAPSULE | ORAL | 0 refills | Status: AC

## 2024-01-24 NOTE — Telephone Encounter (Signed)
 Rx resent.

## 2024-01-24 NOTE — Telephone Encounter (Signed)
 Copied from CRM [id number]. Topic: Clinical - Prescription Issue >> Jan 24, 2024 11:01 AM [place] wrote: Reason for CRM: Prescription for Vitamin D  was sent to incorrect pharmacy.Sandra Alvarado it was supposed to be sent to  [id number] - [place], [place] - [address] AT [facility] [address] [place] [address] Phone: [phone] Fax: [phone]

## 2024-02-07 ENCOUNTER — Encounter: Payer: Self-pay | Admitting: Bariatrics

## 2024-02-07 ENCOUNTER — Ambulatory Visit (INDEPENDENT_AMBULATORY_CARE_PROVIDER_SITE_OTHER): Admitting: Bariatrics

## 2024-02-07 VITALS — BP 102/68 | HR 61 | Temp 97.7°F | Ht 61.5 in | Wt 184.0 lb

## 2024-02-07 DIAGNOSIS — R5383 Other fatigue: Secondary | ICD-10-CM | POA: Diagnosis not present

## 2024-02-07 DIAGNOSIS — E7800 Pure hypercholesterolemia, unspecified: Secondary | ICD-10-CM | POA: Diagnosis not present

## 2024-02-07 DIAGNOSIS — Z6834 Body mass index (BMI) 34.0-34.9, adult: Secondary | ICD-10-CM | POA: Diagnosis not present

## 2024-02-07 DIAGNOSIS — R0602 Shortness of breath: Secondary | ICD-10-CM

## 2024-02-07 DIAGNOSIS — E669 Obesity, unspecified: Secondary | ICD-10-CM | POA: Diagnosis not present

## 2024-02-07 DIAGNOSIS — Z1331 Encounter for screening for depression: Secondary | ICD-10-CM

## 2024-02-07 DIAGNOSIS — R7309 Other abnormal glucose: Secondary | ICD-10-CM | POA: Diagnosis not present

## 2024-02-07 DIAGNOSIS — Z0000 Encounter for general adult medical examination without abnormal findings: Secondary | ICD-10-CM

## 2024-02-07 NOTE — Progress Notes (Signed)
 At a Glance:  Vitals Temp: 97.7 F (36.5 C) BP: 102/68 Pulse Rate: 61 SpO2: 95 %   Anthropometric Measurements Height: 5' 1.5 (1.562 m) Weight: 184 lb (83.5 kg) BMI (Calculated): 34.21 Starting Weight: 184lb   Body Composition  Body Fat %: 44.6 % Fat Mass (lbs): 82.2 lbs Muscle Mass (lbs): 97 lbs Total Body Water (lbs): 75 lbs Visceral Fat Rating : 10   Other Clinical Data RMR: 1613 Fasting: yes Labs: yes Today's Visit #: 1 Starting Date: 02/07/24    EKG: Normal sinus rhythm, rate 64.  Indirect Calorimeter:   Resting Metabolic Rate ( RMR):  RMR (actual): 1613 kcal RMR (calculated): 1459 kcal The calculated basal metabolic rate is 1610 kcal thus her basal metabolic rate is better than expected.  Plan:   Indirect calorimeter completed, interpreted and reviewed with patient today and allowed to ask questions.  Discussed the implications for the chosen plan and exercise based on the RMR reading.  Will consider repeating the RMR in the future based on weight loss.    Chief Complaint:  Obesity   Subjective:  Sandra Alvarado (MR# [id number]) is a 38 y.o. female who presents for evaluation and treatment of obesity and related comorbidities.   Sandra Alvarado is currently in the action stage of change and ready to dedicate time achieving and maintaining a healthier weight. Sandra Alvarado is interested in becoming our patient and working on intensive lifestyle modifications including (but not limited to) diet and exercise for weight loss.  Sandra Alvarado has been struggling with her weight. She has been unsuccessful in either losing weight, maintaining weight loss, or reaching her healthy weight goal.  Sandra Alvarado's habits were reviewed today and are as follows: Her family eats meals together, she thinks her family will eat healthier with her, she started gaining weight at age of 41 to 38 years of age, she skips meals frequently, she is frequently drinking liquids with calories, and she frequently  makes poor food choices.   Current or previous pharmacotherapy: Phentermine and Other: B12  Response to medication: Lost weight and was able to maintain weight loss  Other Fatigue Sandra Alvarado admits to daytime somnolence at times, and denies waking up still tired. Sandra Alvarado generally gets 6 or 7 hours of sleep per night, and states that she has generally restful sleep. Snoring is present. Apneic episodes is not present. Epworth Sleepiness Score is 3.   Shortness of Breath Sandra Alvarado notes increasing shortness of breath with exercising and seems to be worsening over time with weight gain. She notes getting out of breath sooner with activity than she used to. This has not gotten worse recently. Sandra Alvarado denies shortness of breath at rest or orthopnea.  Depression Screen Sandra Alvarado's Food and Mood (modified PHQ-9) score was 12. 10-14 moderate depression     06/17/2023    2:33 PM  Depression screen PHQ 2/9  Decreased Interest 0  Down, Depressed, Hopeless 0  PHQ - 2 Score 0  Altered sleeping 0  Tired, decreased energy 0  Change in appetite 0  Feeling bad or failure about yourself  0  Trouble concentrating 0  Moving slowly or fidgety/restless 0  Suicidal thoughts 0  PHQ-9 Score 0  Difficult doing work/chores Not difficult at all     Assessment and Plan:   Other Fatigue Sandra Alvarado does feel that her weight is causing her energy to be lower than it should be. Fatigue may be related to obesity, depression or many other causes. Labs will be ordered, and in  the meanwhile, Sandra Alvarado will focus on self care including making healthy food choices, increasing physical activity and focusing on stress reduction.  Shortness of Breath Sandra Alvarado does feel that she gets out of breath more easily that she used to when she exercises. Sandra Alvarado's shortness of breath appears to be obesity related and exercise induced. She has agreed to work on weight loss and gradually increase exercise to treat her exercise induced shortness of breath.  Will continue to monitor closely.  Health Maintenance:   Obesity   Plan: Will do EKG, indirect calorimetry, and labs.     Vitamin D  Deficiency Vitamin D  is not at goal of 50.  Most recent vitamin D  level was 26.73. She is at risk for vitamin D  deficiency due to obesity.  She is on  prescription ergocalciferol  50,000 IU weekly. Lab Results  Component Value Date   VD25OH 26.73 (L) 01/04/2024   VD25OH 19.70 (L) 09/15/2023   VD25OH 16 (L) 06/17/2023    Plan: Continue prescription vitamin D  50,000 IU weekly.  Will check for vitamin D  deficiency.   Sandra Alvarado had a positive depression screening. Depression is commonly associated with obesity and often results in emotional eating behaviors. We will monitor this closely and work on CBT to help improve the non-hunger eating patterns. Referral to Psychology may be required if no improvement is seen as she continues in our clinic.    Elevated glucose:   She is at risk for insulin resistance.  She denies a family history of diabetes.   Plan:  Will do labs today ( HgbA1c and fasting insulin ).  Will minimize all carbohydrates both sweets and starches.  Will incorporate more plant-based foods.   Elevated cholesterol:   She has a history of slightly elevated cholesterol.  She is not taking any cholesterol medication.   Plan: Will do a lipid panel.   Previous labs reviewed today. Date: 01/04/24 Vit D  Labs done today CMP, Lipids, Insulin, HgbA1c, and Thyroid  Panel   Generalized Obesity: BMI (Calculated): 34.21   Sandra Alvarado is currently in the action stage of change and her goal is to begin weight loss efforts. I recommend Sandra Alvarado begin the structured treatment plan as follows:  She has agreed to Category 2 Plan  Exercise goals: All adults should avoid inactivity. Some activity is better than none, and adults who participate in any amount of physical activity, gain some health benefits.  Behavioral modification strategies:increasing lean  protein intake, increase H2O intake, increase high fiber foods, no skipping meals, meal planning and cooking strategies, keeping healthy foods in the home, better snacking choices, avoiding temptations, and planning for success  She was informed of the importance of frequent follow-up visits to maximize her success with intensive lifestyle modifications for her multiple health conditions. She was informed we would discuss her lab results at her next visit unless there is a critical issue that needs to be addressed sooner. Sandra Alvarado agreed to keep her next visit at the agreed upon time to discuss these results.  Objective:  General: Cooperative, alert, well developed, in no acute distress. HEENT: Conjunctivae and lids unremarkable. Cardiovascular: Regular rhythm.  Lungs: Normal work of breathing. Neurologic: No focal deficits.   Lab Results  Component Value Date   CREATININE 0.73 06/16/2023   BUN 13 06/16/2023   NA 138 06/16/2023   K 4.3 06/16/2023   CL 107 06/16/2023   CO2 25 06/16/2023   Lab Results  Component Value Date   ALT 21 06/16/2023   AST 15  06/16/2023   ALKPHOS 77 06/16/2023   BILITOT 0.4 06/16/2023   No results found for: HGBA1C No results found for: INSULIN Lab Results  Component Value Date   TSH 3.93 06/16/2023   Lab Results  Component Value Date   CHOL 171 06/16/2023   HDL 34.10 (L) 06/16/2023   LDLCALC 116 (H) 06/16/2023   TRIG 106.0 06/16/2023   CHOLHDL 5 06/16/2023   Lab Results  Component Value Date   WBC 8.5 06/16/2023   HGB 13.3 06/16/2023   HCT 41.3 06/16/2023   MCV 91.3 06/16/2023   PLT 292.0 06/16/2023   No results found for: IRON, TIBC, FERRITIN  Attestation Statements:  Applicable history such as the following:  allergies, medications, problem list, medical history, surgical history, family history, social history, and previous encounter notes reviewed by clinician on day of visit:  Time spent on visit in care of the patient today  including the items listed below was 45 minutes.    15 minutes were spent talking about the history, 20 minutes for face to face counseling implementing the plan, discussing the specifics of how to arrange meals, meal planning, water intake.   I spent face to face time discussing his/her plan, including breakfast, additional breakfast options, lunch, and dinner options, grocery list, and snacks.  I reviewed her indirect calorimetry. I discussed the implications for the diet plan.    Discussed the bio-impedence test (fat %, muscle mass, and water weight) and allowed the patient to ask questions.   Discussed the following information sheets: Category 2, Grocery List, 100 Calorie Snacks, 200 Calorie Snacks, and Protein shakes and vegetable oil.   I reviewed the labs which were ordered from her visit on 01/04/24.   I additionally spent time documenting, reviewing, and checking the codes before submitting.   This may have been prepared with the assistance of Engineer, civil (consulting).  Occasional wrong-word or sound-a-like substitutions may have occurred due to the inherent limitations of voice recognition software.    Kirk Peper, DO

## 2024-02-08 ENCOUNTER — Encounter: Payer: Self-pay | Admitting: Bariatrics

## 2024-02-08 DIAGNOSIS — E1169 Type 2 diabetes mellitus with other specified complication: Secondary | ICD-10-CM | POA: Insufficient documentation

## 2024-02-08 LAB — INSULIN, RANDOM: INSULIN: 12.3 u[IU]/mL (ref 2.6–24.9)

## 2024-02-08 LAB — COMPREHENSIVE METABOLIC PANEL WITH GFR
ALT: 32 IU/L (ref 0–32)
AST: 23 IU/L (ref 0–40)
Albumin: 4.1 g/dL (ref 3.9–4.9)
Alkaline Phosphatase: 80 IU/L (ref 44–121)
BUN/Creatinine Ratio: 19 (ref 9–23)
BUN: 14 mg/dL (ref 6–20)
Bilirubin Total: 0.2 mg/dL (ref 0.0–1.2)
CO2: 22 mmol/L (ref 20–29)
Calcium: 9 mg/dL (ref 8.7–10.2)
Chloride: 104 mmol/L (ref 96–106)
Creatinine, Ser: 0.73 mg/dL (ref 0.57–1.00)
Globulin, Total: 2.5 g/dL (ref 1.5–4.5)
Glucose: 87 mg/dL (ref 70–99)
Potassium: 5 mmol/L (ref 3.5–5.2)
Sodium: 140 mmol/L (ref 134–144)
Total Protein: 6.6 g/dL (ref 6.0–8.5)
eGFR: 109 mL/min/{1.73_m2} (ref >59)

## 2024-02-08 LAB — HEMOGLOBIN A1C
Est. average glucose Bld gHb Est-mCnc: 103 mg/dL
Hgb A1c MFr Bld: 5.2 % (ref 4.8–5.6)

## 2024-02-08 LAB — LIPID PANEL WITH LDL/HDL RATIO
Cholesterol, Total: 202 mg/dL — ABNORMAL HIGH (ref 100–199)
HDL: 37 mg/dL — ABNORMAL LOW (ref >39)
LDL Chol Calc (NIH): 145 mg/dL — ABNORMAL HIGH (ref 0–99)
LDL/HDL Ratio: 3.9 ratio — ABNORMAL HIGH (ref 0.0–3.2)
Triglycerides: 109 mg/dL (ref 0–149)
VLDL Cholesterol Cal: 20 mg/dL (ref 5–40)

## 2024-02-08 LAB — TSH+T4F+T3FREE
Free T4: 1.34 ng/dL (ref 0.82–1.77)
T3, Free: 3.3 pg/mL (ref 2.0–4.4)
TSH: 2.89 u[IU]/mL (ref 0.450–4.500)

## 2024-02-13 ENCOUNTER — Encounter: Payer: Self-pay | Admitting: Bariatrics

## 2024-02-21 ENCOUNTER — Ambulatory Visit: Admitting: Bariatrics

## 2024-02-21 ENCOUNTER — Encounter: Payer: Self-pay | Admitting: Bariatrics

## 2024-02-21 VITALS — BP 103/69 | HR 62 | Temp 98.0°F | Ht 61.5 in | Wt 180.0 lb

## 2024-02-21 DIAGNOSIS — Z6833 Body mass index (BMI) 33.0-33.9, adult: Secondary | ICD-10-CM | POA: Diagnosis not present

## 2024-02-21 DIAGNOSIS — R632 Polyphagia: Secondary | ICD-10-CM | POA: Diagnosis not present

## 2024-02-21 DIAGNOSIS — E7800 Pure hypercholesterolemia, unspecified: Secondary | ICD-10-CM

## 2024-02-21 DIAGNOSIS — E669 Obesity, unspecified: Secondary | ICD-10-CM | POA: Diagnosis not present

## 2024-02-21 DIAGNOSIS — E785 Hyperlipidemia, unspecified: Secondary | ICD-10-CM | POA: Diagnosis not present

## 2024-02-21 NOTE — Progress Notes (Unsigned)
  First follow-up after initial visit.        WEIGHT SUMMARY AND BIOMETRICS  Weight Lost Since Last Visit: 4lb  Weight Gained Since Last Visit: 0   Vitals Temp: 98 F (36.7 C) BP: 103/69 Pulse Rate: 62 SpO2: 96 %   Anthropometric Measurements Height: 5' 1.5 (1.562 m) Weight: 180 lb (81.6 kg) BMI (Calculated): 33.46 Weight at Last Visit: 184lb Weight Lost Since Last Visit: 4lb Weight Gained Since Last Visit: 0 Starting Weight: 184lb Total Weight Loss (lbs): 4 lb (1.814 kg)   Body Composition  Body Fat %: 44.9 % Fat Mass (lbs): 80.8 lbs Muscle Mass (lbs): 94.2 lbs Total Body Water (lbs): 75.8 lbs Visceral Fat Rating : 9   Other Clinical Data Fasting: no Labs: no Today's Visit #: 2 Starting Date: 02/07/24    OBESITY Sandra Alvarado is here to discuss her progress with her obesity treatment plan along with follow-up of her obesity related diagnoses.    Nutrition Plan: the Category 2 plan - 95% adherence.  Current exercise: none  Interim History:  She is down 4 lbs since her first visit.  {aabnutritionassessment:29213}  Initial positives regarding the dietary plan:  Initial challenges regarding  the dietary plan: unable to get enough protein.   Pharmacotherapy: Sandra Alvarado is on {dwwpharmacotherapy:29109} Adverse side effects: {dwwse:29122} Hunger is {EWCONTROLASSESSMENT:24261}.  Cravings are {EWCONTROLASSESSMENT:24261}.  Assessment/Plan:   Hyperlipidemia LDL is not at goal. Medication(s): *** Cardiovascular risk factors: {cv risk factors:510}  Lab Results  Component Value Date   CHOL 202 (H) 02/07/2024   HDL 37 (L) 02/07/2024   LDLCALC 145 (H) 02/07/2024   TRIG 109 02/07/2024   CHOLHDL 5 06/16/2023   Lab Results  Component Value Date   ALT 32 02/07/2024   AST 23 02/07/2024   ALKPHOS 80 02/07/2024   BILITOT 0.2 02/07/2024   The ASCVD  Risk score (Arnett DK, et al., 2019) failed to calculate for the following reasons:   The 2019 ASCVD risk score is only valid for ages 18 to 64  Plan:  Continue statin.  Information sheet on healthy vs unhealthy fats.  Will avoid all trans fats.  Will read labels Will minimize saturated fats except the following: low fat meats in moderation, diary, and limited dark chocolate.  Increase Omega 3 in foods, and consider an Omega 3 supplement.  ***       {dwwmorbid:29108::Morbid Obesity}: Current BMI BMI (Calculated): 33.46   Pharmacotherapy Plan {dwwmed:29123}  {dwwpharmacotherapy:29109}  Sandra Alvarado {CHL AMB IS/IS NOT:210130109} currently in the action stage of change. As such, her goal is to {MWMwtloss#1:210800005}.  She has agreed to {dwwsldiets:29085}.  Exercise goals: {MWM EXERCISE RECS:23473}  Behavioral modification strategies: {dwwslwtlossstrategies:29088}.  Sandra Alvarado has agreed to follow-up with our clinic in 2 weeks with Sandra Tickeroff NP for exercise information.     Labs reviewed today from last visit (CMP, Lipids, HgbA1c, insulin , vitamin D , B 12, and thyroid  panel).   Objective:   VITALS: Per patient if applicable, see vitals. GENERAL: Alert and in no acute distress. CARDIOPULMONARY: No increased WOB. Speaking in clear sentences.  PSYCH: Pleasant and cooperative. Speech normal rate and rhythm. Affect is appropriate. Insight and judgement are appropriate. Attention is focused, linear, and appropriate.  NEURO: Oriented as arrived to appointment on time with no prompting.   Attestation Statements:   This was prepared with the assistance of Engineer, civil (consulting).  Occasional wrong-word or sound-a-like substitutions may have occurred due to the inherent limitations of voice recognition software.

## 2024-03-05 ENCOUNTER — Ambulatory Visit: Admitting: Nurse Practitioner

## 2024-03-05 ENCOUNTER — Encounter: Payer: Self-pay | Admitting: Nurse Practitioner

## 2024-03-05 VITALS — BP 101/65 | HR 58 | Temp 98.1°F | Ht 61.5 in | Wt 178.0 lb

## 2024-03-05 DIAGNOSIS — Z6833 Body mass index (BMI) 33.0-33.9, adult: Secondary | ICD-10-CM

## 2024-03-05 DIAGNOSIS — E66811 Obesity, class 1: Secondary | ICD-10-CM | POA: Diagnosis not present

## 2024-03-05 DIAGNOSIS — R632 Polyphagia: Secondary | ICD-10-CM | POA: Diagnosis not present

## 2024-03-05 DIAGNOSIS — E6609 Other obesity due to excess calories: Secondary | ICD-10-CM

## 2024-03-05 NOTE — Progress Notes (Addendum)
 Office: [phone]  /  Fax: [phone]  WEIGHT SUMMARY AND BIOMETRICS  Weight Lost Since Last Visit: 6lb  Weight Gained Since Last Visit: 0lb   Vitals Temp: 98.1 F (36.7 C) BP: 101/65 Pulse Rate: (!) 58 SpO2: 100 %   Anthropometric Measurements Height: 5' 1.5 (1.562 m) Weight: 178 lb (80.7 kg) BMI (Calculated): 33.09 Weight at Last Visit: 184lb Weight Lost Since Last Visit: 6lb Weight Gained Since Last Visit: 0lb Starting Weight: 184lb Total Weight Loss (lbs): 6 lb (2.722 kg)   Body Composition  Body Fat %: 43.8 % Fat Mass (lbs): 78 lbs Muscle Mass (lbs): 95 lbs Total Body Water (lbs): 73.4 lbs Visceral Fat Rating : 9   Other Clinical Data Fasting: No Labs: No Today's Visit #: 3 Starting Date: 02/07/24     HPI  Chief Complaint: OBESITY  Sandra Alvarado is here to discuss her progress with her obesity treatment plan. She is on the the Category 2 Plan and states she is following her eating plan approximately 90-95 % of the time. She states she is exercising 0 minutes 0 days per week.   Interval History:  Since last office visit she has lost 2 pounds.  She is doing well with breakfast and lunch.  She is struggling with polyphagia between lunch and dinner even with a small snack.  She is drinking water daily and is working on increasing her water intake.  Denies sugary drinks. She is also drinking seltzer water 2 days per week.  She is limiting on exercising due to knee pain.    BF:  2 eggs with 1-2 45 cal bread with sugar free jelly and cheese or ensure shake with 2 sausage links or cereal with vanilla milk.   Snack:  none Lunch:  salad with protein or [place] sandwich with fruit Snack:  none or yogurt or pita crackers with tomato with [place] dressing or cheese or vegetables or fruit  Dinner:  protein and vegetables.    Pharmacotherapy for weight loss: She is not currently taking medications  for medical weight loss.    Previous pharmacotherapy for medical  weight loss:  Phentermine and Vit B12  Bariatric surgery:  Patient has not had bariatric surgery   PHYSICAL EXAM:  Blood pressure 101/65, pulse (!) 58, temperature 98.1 F (36.7 C), height 5' 1.5 (1.562 m), weight 178 lb (80.7 kg), last menstrual period 02/21/2024, SpO2 100%. Body mass index is 33.09 kg/m.  General: She is overweight, cooperative, alert, well developed, and in no acute distress. PSYCH: Has normal mood, affect and thought process.   Extremities: No edema.  Neurologic: No gross sensory or motor deficits. No tremors or fasciculations noted.    DIAGNOSTIC DATA REVIEWED:  BMET    Component Value Date/Time   NA 140 02/07/2024 0928   K 5.0 02/07/2024 0928   CL 104 02/07/2024 0928   CO2 22 02/07/2024 0928   GLUCOSE 87 02/07/2024 0928   GLUCOSE 89 06/16/2023 0927   BUN 14 02/07/2024 0928   CREATININE 0.73 02/07/2024 0928   CREATININE 0.72 05/11/2019 1512   CALCIUM 9.0 02/07/2024 0928   Lab Results  Component Value Date   HGBA1C 5.2 02/07/2024   Lab Results  Component Value Date   INSULIN  12.3 02/07/2024   Lab Results  Component Value Date   TSH 2.890 02/07/2024   CBC    Component Value Date/Time   WBC 8.5 06/16/2023 0927   RBC 4.52 06/16/2023 0927   HGB 13.3 06/16/2023 0927  HCT 41.3 06/16/2023 0927   PLT 292.0 06/16/2023 0927   MCV 91.3 06/16/2023 0927   MCH 29.8 05/11/2019 1512   MCHC 32.2 06/16/2023 0927   RDW 12.7 06/16/2023 0927   Iron Studies No results found for: IRON, TIBC, FERRITIN, IRONPCTSAT Lipid Panel     Component Value Date/Time   CHOL 202 (H) 02/07/2024 0928   TRIG 109 02/07/2024 0928   HDL 37 (L) 02/07/2024 0928   CHOLHDL 5 06/16/2023 0927   VLDL 21.2 06/16/2023 0927   LDLCALC 145 (H) 02/07/2024 0928   LDLCALC 114 (H) 05/11/2019 1512   Hepatic Function Panel     Component Value Date/Time   PROT 6.6 02/07/2024 0928   ALBUMIN 4.1 02/07/2024 0928   AST 23 02/07/2024 0928   ALT 32 02/07/2024 0928   ALKPHOS 80  02/07/2024 0928   BILITOT 0.2 02/07/2024 0928   BILIDIR 0.1 05/11/2019 1512   IBILI 0.5 05/11/2019 1512      Component Value Date/Time   TSH 2.890 02/07/2024 0928   Nutritional Lab Results  Component Value Date   VD25OH 26.73 (L) 01/04/2024   VD25OH 19.70 (L) 09/15/2023   VD25OH 16 (L) 06/17/2023     ASSESSMENT AND PLAN  TREATMENT PLAN FOR OBESITY:  Recommended Dietary Goals  Sandra Alvarado is currently in the action stage of change. As such, her goal is to continue weight management plan. She has agreed to the Category 2 Plan.  Behavioral Intervention  We discussed the following Behavioral Modification Strategies today: increasing lean protein intake to established goals, decreasing simple carbohydrates , increasing vegetables, increasing fiber rich foods, increasing water intake , work on meal planning and preparation, reading food labels , keeping healthy foods at home, and continue to work on maintaining a reduced calorie state, getting the recommended amount of protein, incorporating whole foods, making healthy choices, staying well hydrated and practicing mindfulness when eating..  Additional resources provided today: NA  Recommended Physical Activity Goals  Sandra Alvarado has been advised to work up to 150 minutes of moderate intensity aerobic activity a week and strengthening exercises 2-3 times per week for cardiovascular health, weight loss maintenance and preservation of muscle mass.   She has agreed to Think about enjoyable ways to increase daily physical activity and overcoming barriers to exercise, Increase physical activity in their day and reduce sedentary time (increase NEAT)., and Work on scheduling and tracking physical activity.     ASSOCIATED CONDITIONS ADDRESSED TODAY  Action/Plan  Polyphagia Intensive lifestyle modifications are the first line treatment for this issue. We discussed several lifestyle modifications today and she will continue to work on diet, exercise  and weight loss efforts. Orders and follow up as documented in patient record.  Counseling Polyphagia is excessive hunger. Causes can include: low blood sugars, hypERthyroidism, PMS, lack of sleep, stress, insulin  resistance, diabetes, certain medications, and diets that are deficient in protein and fiber.    To add in a snack twice per day.    Class 1 obesity due to excess calories without serious comorbidity with body mass index (BMI) of 33.0 to 33.9 in adult     Options of exercising discussed today:  [place] app Chair exercising on youtube-[facility] Resistance training-youtube-[employer]  Multiple handouts given today on protein snacks, protein 2 oz equivalents, protein content of food  Return in about 4 weeks (around 04/02/2024).Sandra Alvarado She was informed of the importance of frequent follow up visits to maximize her success with intensive lifestyle modifications for her multiple health conditions.  ATTESTASTION STATEMENTS:  Reviewed by clinician on day of visit: allergies, medications, problem list, medical history, surgical history, family history, social history, and previous encounter notes.   Time spent on visit including pre-visit chart review and post-visit care and charting was 30 minutes.    Corean SAUNDERS. Danish Ruffins FNP-C

## 2024-03-29 ENCOUNTER — Ambulatory Visit: Payer: Self-pay

## 2024-03-29 NOTE — Telephone Encounter (Signed)
 FYI Only or Action Required?: FYI only for provider.  Patient was last seen in primary care on 03/05/2024 by Becki Krabbe, FNP.  Called Nurse Triage reporting Abdominal Cramping, Back Pain, and Urinary Frequency.  Symptoms began 1 day to several months ago.  Interventions attempted: Nothing.  Symptoms are: lower back pain, lower abdominal cramping, urinary frequency, urinary retention, urinary urgency gradually worsening.  Triage Disposition: See Physician Within 24 Hours (overriding See HCP Within 4 Hours (Or PCP Triage))  Patient/caregiver understands and will follow disposition?: Yes            Copied from CRM [id number]. Topic: Clinical - Red Word Triage >> Mar 29, 2024  3:17 PM Sandra Alvarado wrote: Red Word that prompted transfer to Nurse Triage: Patient states she may have a UTI, she is experiencing lower back pain, stomach cramping, a few weeks ago she felt she had some more frequent urination but the frequency has improved recently. Reason for Disposition  Side (flank) or lower back pain present  Answer Assessment - Initial Assessment Questions 1. SYMPTOM: What's the main symptom you're concerned about? (e.g., frequency, incontinence)     Frequency, urgency and retention.  2. ONSET: When did the  urinary symptoms start?     Mid June.  3. PAIN: Is there any pain? If Yes, ask: How bad is it? (Scale: 1-10; mild, moderate, severe)     Lower abdominal cramps since last night intermittent, lower back constant  x 1.5 weeks 5/10.   4. CAUSE: What do you think is causing the symptoms?     UTI.  5. OTHER SYMPTOMS: Do you have any other symptoms? (e.g., blood in urine, fever, flank pain, pain with urination)     Patient denies vaginal discharge, blood in urine, nausea, vomiting, pain or burning with urination, fever.  6. PREGNANCY: Is there any chance you are pregnant? When was your last menstrual period?     LMP: 10 days ago.  Protocols used: Urinary  Symptoms-A-AH

## 2024-03-30 ENCOUNTER — Encounter: Payer: Self-pay | Admitting: Family Medicine

## 2024-03-30 ENCOUNTER — Ambulatory Visit: Admitting: Family Medicine

## 2024-03-30 VITALS — BP 110/70 | HR 75 | Temp 98.1°F | Resp 18 | Ht 61.5 in | Wt 177.2 lb

## 2024-03-30 DIAGNOSIS — N898 Other specified noninflammatory disorders of vagina: Secondary | ICD-10-CM | POA: Diagnosis not present

## 2024-03-30 DIAGNOSIS — M545 Low back pain, unspecified: Secondary | ICD-10-CM

## 2024-03-30 LAB — POC URINALSYSI DIPSTICK (AUTOMATED)
Bilirubin, UA: NEGATIVE
Blood, UA: NEGATIVE
Glucose, UA: NEGATIVE
Ketones, UA: NEGATIVE
Leukocytes, UA: NEGATIVE
Nitrite, UA: NEGATIVE
Protein, UA: NEGATIVE
Spec Grav, UA: <=1.005 — AB (ref 1.010–1.025)
Urobilinogen, UA: 0.2 U/dL
pH, UA: 6.5 (ref 5.0–8.0)

## 2024-03-30 NOTE — Progress Notes (Signed)
 Subjective:    Patient ID: Sandra Alvarado, female    DOB: [birth year], 38 y.o.   MRN: [id number]  Chief Complaint  Patient presents with   Back Pain    Sxs started off and on since late June. Pt states having cramps, frequ, not pain or burning. No vaginal discharge    HPI Patient is in today for flank pain and symptoms of uti/ yeast.   Discussed the use of AI scribe software for clinical note transcription with the patient, who gave verbal consent to proceed.  History of Present Illness Sandra Alvarado is a 38 year old female who presents with symptoms suggestive of a urinary tract infection or yeast infection.  She experiences urinary frequency and a sensation of incomplete bladder emptying. She has not tried any over-the-counter treatments for these symptoms.  Two nights ago, she began experiencing lower back pain and stomach cramping. No diarrhea or discharge is present, but there is possible slight itching without odor.  Her past medical history includes a previous urinary tract infection that progressed to a kidney infection, which was treated with medication after she declined overnight IV treatment. During that episode, she experienced severe lower back pain and fever.  She has been increasing her water intake as part of a weight loss program, which she initially thought might be causing her increased urination frequency.   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   History of chicken pox    History of herpes simplex infection    Irregular periods/menstrual cycles    Vitamin D  deficiency     Past Surgical History:  Procedure Laterality Date    COLPOSCOPY  01/17/09   DILATION AND EVACUATION N/A 03/21/2015   Procedure: DILATATION AND EVACUATION Ultrasound Guided;  Surgeon: Ovid All, MD;  Location: [facility];  Service: Gynecology;  Laterality: N/A;   WISDOM TOOTH EXTRACTION      Family History  Problem Relation Age of Onset   Thyroid  disease Mother    Diabetes Mother        type II   Anxiety disorder Mother    Hypertension Father    Hyperlipidemia Father    Sleep apnea Father    Hypertension Maternal Grandmother    Thyroid  disease Maternal Grandmother    Arthritis Maternal Grandmother    Depression Maternal Grandmother    Alzheimer's disease Maternal Grandmother    Alcohol abuse Maternal Grandfather    Hyperlipidemia Paternal Grandmother    Alcohol abuse Paternal Grandfather     Social History   Socioeconomic History   Marital status: Married    Spouse name: Not on file   Number of children: Not on file   Years of education: Not on file   Highest education level: Not on file  Occupational History   Not on file  Tobacco Use   Smoking status: Former    Current packs/day: 0.00    Types: Cigarettes    Quit date: 01/13/2015    Years since quitting: 9.2   Smokeless tobacco: Never  Vaping Use   Vaping status: Never  Used  Substance and Sexual Activity   Alcohol use: Yes    Comment: rare   Drug use: No   Sexual activity: Yes    Birth control/protection: None  Other Topics Concern   Not on file  Social History Narrative   Lives with her daughter (2008) and her husband.     Works as a Network engineer at [employer]   Completed associates degree, some bachelors    Enjoys sleeping/reading, movies, eating and  spending time with daughter   Has a Boston Terrier   Born in [place], raised in [place], moved here for 11 years   Social Drivers of Corporate investment banker Strain: Not on [employer] Insecurity: Not on file  Transportation Needs: Not on file  Physical Activity: Not on file  Stress: Not on file   Social Connections: Not on file  Intimate Partner Violence: Not on file    Outpatient Medications Prior to Visit  Medication Sig Dispense Refill   Prenat MV-Min w/Fe-Folate-DHA (PRENATAL COMPLETE PO) Take by mouth.     Vitamin D , Ergocalciferol , (DRISDOL ) 1.25 MG (50000 UNIT) CAPS capsule Take 1 capsule (50,000 Units total) by mouth every 7 (seven) days. 12 capsule 0   albuterol  (VENTOLIN  HFA) 108 (90 Base) MCG/ACT inhaler Inhale 2 puffs into the lungs every 6 (six) hours as needed. (Patient not taking: Reported on 03/05/2024) 18 g 0   budesonide -formoterol  (SYMBICORT ) 160-4.5 MCG/ACT inhaler Inhale 2 puffs into the lungs 2 (two) times daily. (Patient not taking: Reported on 03/05/2024) 3 each 6   Spacer/Aero-Holding Raguel FRENCH Use with inhaler (Patient not taking: Reported on 03/05/2024) 1 each 1   No facility-administered medications prior to visit.    No Known Allergies  Review of Systems  Constitutional:  Negative for fever and malaise/fatigue.  HENT:  Negative for congestion.   Eyes:  Negative for blurred vision.  Respiratory:  Negative for cough and shortness of breath.   Cardiovascular:  Negative for chest pain, palpitations and leg swelling.  Gastrointestinal:  Negative for vomiting.  Genitourinary:  Positive for flank pain.  Musculoskeletal:  Positive for back pain.  Skin:  Negative for rash.  Neurological:  Negative for loss of consciousness and headaches.       Objective:    Physical Exam  BP 110/70 (BP Location: Left Arm, Patient Position: Sitting, Cuff Size: Large)   Pulse 75   Temp 98.1 F (36.7 C) (Oral)   Resp 18   Ht 5' 1.5 (1.562 m)   Wt 177 lb 3.2 oz (80.4 kg)   LMP 03/20/2024 (Exact Date)   SpO2 98%   BMI 32.94 kg/m  Wt Readings from Last 3 Encounters:  03/30/24 177 lb 3.2 oz (80.4 kg)  03/05/24 178 lb (80.7 kg)  02/21/24 180 lb (81.6 kg)    Diabetic Foot Exam - Simple   No data filed    Lab Results  Component Value Date   WBC 8.5  06/16/2023   HGB 13.3 06/16/2023   HCT 41.3 06/16/2023   PLT 292.0 06/16/2023   GLUCOSE 87 02/07/2024   CHOL 202 (H) 02/07/2024   TRIG 109 02/07/2024   HDL 37 (L) 02/07/2024   LDLCALC 145 (H) 02/07/2024   ALT 32 02/07/2024   AST 23 02/07/2024   NA 140 02/07/2024   K 5.0 02/07/2024   CL 104 02/07/2024   CREATININE 0.73 02/07/2024   BUN 14 02/07/2024   CO2 22 02/07/2024   TSH 2.890 02/07/2024   HGBA1C  5.2 02/07/2024    Lab Results  Component Value Date   TSH 2.890 02/07/2024   Lab Results  Component Value Date   WBC 8.5 06/16/2023   HGB 13.3 06/16/2023   HCT 41.3 06/16/2023   MCV 91.3 06/16/2023   PLT 292.0 06/16/2023   Lab Results  Component Value Date   NA 140 02/07/2024   K 5.0 02/07/2024   CO2 22 02/07/2024   GLUCOSE 87 02/07/2024   BUN 14 02/07/2024   CREATININE 0.73 02/07/2024   BILITOT 0.2 02/07/2024   ALKPHOS 80 02/07/2024   AST 23 02/07/2024   ALT 32 02/07/2024   PROT 6.6 02/07/2024   ALBUMIN 4.1 02/07/2024   CALCIUM 9.0 02/07/2024   EGFR 109 02/07/2024   GFR 106.32 06/16/2023   Lab Results  Component Value Date   CHOL 202 (H) 02/07/2024   Lab Results  Component Value Date   HDL 37 (L) 02/07/2024   Lab Results  Component Value Date   LDLCALC 145 (H) 02/07/2024   Lab Results  Component Value Date   TRIG 109 02/07/2024   Lab Results  Component Value Date   CHOLHDL 5 06/16/2023   Lab Results  Component Value Date   HGBA1C 5.2 02/07/2024       Assessment & Plan:  Vaginal itching -     POCT Urinalysis Dipstick (Automated) -     NuSwab Vaginitis Plus (VG+)  Acute low back pain without sciatica, unspecified back pain laterality -     Urine Culture   Assessment and Plan Assessment & Plan Evaluation of urinary symptoms (frequency, incomplete emptying, lower abdominal and back pain)   She presents with urinary frequency, sensation of incomplete bladder emptying, and lower abdominal and back pain, suggesting a possible urinary tract  infection (UTI). Previous episodes have progressed to pyelonephritis. Differential diagnosis includes UTI and potential kidney involvement. Perform urinalysis to evaluate for UTI.  Evaluation of vaginal itching   She reports mild vaginal itching without discharge or odor, indicating a possible yeast infection. Perform self-swab for yeast infection and send swab for laboratory analysis.   Sandra Hojnacki R Lowne Chase, DO

## 2024-03-31 LAB — URINE CULTURE
MICRO NUMBER:: 16806276
Result:: NO GROWTH
SPECIMEN QUALITY:: ADEQUATE

## 2024-04-01 ENCOUNTER — Ambulatory Visit: Payer: Self-pay | Admitting: Family Medicine

## 2024-04-02 ENCOUNTER — Ambulatory Visit: Admitting: Bariatrics

## 2024-04-02 ENCOUNTER — Encounter: Payer: Self-pay | Admitting: Bariatrics

## 2024-04-02 VITALS — BP 109/68 | HR 74 | Temp 97.8°F | Ht 61.5 in | Wt 173.0 lb

## 2024-04-02 DIAGNOSIS — E66811 Obesity, class 1: Secondary | ICD-10-CM

## 2024-04-02 DIAGNOSIS — Z6832 Body mass index (BMI) 32.0-32.9, adult: Secondary | ICD-10-CM

## 2024-04-02 DIAGNOSIS — E6609 Other obesity due to excess calories: Secondary | ICD-10-CM

## 2024-04-02 DIAGNOSIS — R632 Polyphagia: Secondary | ICD-10-CM

## 2024-04-02 DIAGNOSIS — E669 Obesity, unspecified: Secondary | ICD-10-CM

## 2024-04-02 NOTE — Progress Notes (Signed)
 WEIGHT SUMMARY AND BIOMETRICS  Weight Lost Since Last Visit: 5lb  Weight Gained Since Last Visit: 0   Vitals Temp: 97.8 F (36.6 C) BP: 109/68 Pulse Rate: 74 SpO2: 96 %   Anthropometric Measurements Height: 5' 1.5 (1.562 m) Weight: 173 lb (78.5 kg) BMI (Calculated): 32.16 Weight at Last Visit: 178lb Weight Lost Since Last Visit: 5lb Weight Gained Since Last Visit: 0 Starting Weight: 184lb Total Weight Loss (lbs): 11 lb (4.99 kg)   Body Composition  Body Fat %: 43.1 % Fat Mass (lbs): 74.8 lbs Muscle Mass (lbs): 93.6 lbs Total Body Water (lbs): 72 lbs Visceral Fat Rating : 9   Other Clinical Data Fasting: no Labs: no Today's Visit #: 4 Starting Date: 02/07/24    OBESITY Sandra Alvarado is here to discuss her progress with her obesity treatment plan along with follow-up of her obesity related diagnoses.    Nutrition Plan: the Category 2 plan - 75% adherence.  Current exercise: none  Interim History:  She is down 5 lbs since her last visit.  She states that she did not follow the meal plan as well as she could.  She states that she was more consistent and tracking her calories initially. Eating all of the food on the plan., Protein intake is less than prescribed., Is not skipping meals, and Water intake is adequate.   Hunger is moderately controlled.  Cravings are well controlled.  Assessment/Plan:   Sandra Alvarado endorses excessive hunger.  Medication(s): no medications at this time.  Effects of medication:  moderately controlled. Cravings are moderately controlled., less cravings.   Plan: Medication(s): none Will increase water, protein and fiber to help assuage hunger.  Will minimize foods that have a high glucose index/load to minimize reactive hypoglycemia.  She will be more consistent and tracking her calories. She will increase her  protein to around 80 to 90 g/day.   Generalized Obesity: Current BMI BMI (Calculated): 32.16    Sandra Alvarado is currently in the action stage of change. As such, her goal is to continue with weight loss efforts.  She has agreed to the Category 2 plan.  Exercise goals: All adults should avoid inactivity. Some physical activity is better than none, and adults who participate in any amount of physical activity gain some health benefits.  Behavioral modification strategies: increasing lean protein intake, no meal skipping, meal planning , increase water intake, better snacking choices, planning for success, increasing vegetables, avoiding temptations, keep healthy foods in the home, weigh protein portions, measure portion sizes, and mindful eating.  Sandra Alvarado has agreed to follow-up with our clinic in 4 weeks.    Objective:   VITALS: Per patient if applicable, see vitals. GENERAL: Alert and in no acute distress. CARDIOPULMONARY: No increased WOB. Speaking in clear sentences.  PSYCH: Pleasant and cooperative. Speech normal rate and rhythm. Affect is appropriate. Insight and judgement are appropriate.  Attention is focused, linear, and appropriate.  NEURO: Oriented as arrived to appointment on time with no prompting.   Attestation Statements:   This was prepared with the assistance of Engineer, civil (consulting).  Occasional wrong-word or sound-a-like substitutions may have occurred due to the inherent limitations of voice recognition   Clayborne Daring, DO

## 2024-04-03 LAB — NUSWAB VAGINITIS PLUS (VG+)
Candida albicans, NAA: NEGATIVE
Candida glabrata, NAA: NEGATIVE
Chlamydia trachomatis, NAA: NEGATIVE
Neisseria gonorrhoeae, NAA: NEGATIVE
Trich vag by NAA: NEGATIVE

## 2024-05-10 ENCOUNTER — Ambulatory Visit: Admitting: Bariatrics

## 2024-06-27 ENCOUNTER — Encounter: Payer: Self-pay | Admitting: Bariatrics

## 2024-06-27 ENCOUNTER — Ambulatory Visit: Admitting: Bariatrics

## 2024-06-27 VITALS — BP 114/71 | HR 69 | Ht 61.5 in | Wt 169.0 lb

## 2024-06-27 DIAGNOSIS — Z6831 Body mass index (BMI) 31.0-31.9, adult: Secondary | ICD-10-CM

## 2024-06-27 DIAGNOSIS — R632 Polyphagia: Secondary | ICD-10-CM | POA: Diagnosis not present

## 2024-06-27 DIAGNOSIS — E66811 Other obesity due to excess calories: Secondary | ICD-10-CM

## 2024-06-27 DIAGNOSIS — E669 Obesity, unspecified: Secondary | ICD-10-CM | POA: Diagnosis not present

## 2024-06-27 MED ORDER — PHENTERMINE HCL 30 MG PO CAPS: 30.0000 mg | ORAL_CAPSULE | ORAL | 0 refills | Status: DC

## 2024-06-27 NOTE — Progress Notes (Signed)
 WEIGHT SUMMARY AND BIOMETRICS  Weight Lost Since Last Visit: 4lb  Weight Gained Since Last Visit: 0   Vitals BP: 114/71 Pulse Rate: 69 SpO2: 100 %   Anthropometric Measurements Height: 5' 1.5 (1.562 m) Weight: 169 lb (76.7 kg) BMI (Calculated): 31.42 Weight at Last Visit: 173lb Weight Lost Since Last Visit: 4lb Weight Gained Since Last Visit: 0 Starting Weight: 184lb Total Weight Loss (lbs): 15 lb (6.804 kg)   Body Composition  Body Fat %: 42.5 % Fat Mass (lbs): 72 lbs Muscle Mass (lbs): 92.6 lbs Total Body Water (lbs): 71.2 lbs Visceral Fat Rating : 8   Other Clinical Data Fasting: no Labs: no Today's Visit #: 5 Starting Date: 02/07/24    OBESITY Sandra Alvarado is here to discuss her progress with her obesity treatment plan along with follow-up of her obesity related diagnoses.    Nutrition Plan: the Category 2 plan - 25% adherence.  Current exercise: none  Interim History:  She is down 4 lbs since her last visit on 04/02/24. She has had some stressors with family.  Eating all of the food on the plan., Protein intake is less than prescribed., Is drinking sugar sweetened beverages, and Is not skipping meals   Pharmacotherapy: Sandra Alvarado is not on any anti-obesity medications.  Hunger is moderately controlled.  Cravings are moderately controlled.  Assessment/Plan:   Sandra Alvarado endorses excessive hunger.  Medication(s): Had taken Phentermine in the past.  Effects of medication(appetite):  moderately controlled. Cravings are moderately controlled.   Plan: Medication(s): Phentermine 30 mg by mouth daily in am Will increase water, protein and fiber to help assuage hunger.  Will minimize foods that have a high glucose index/load to minimize reactive hypoglycemia.   She is aware that she needs to continue good contraceptive practices, and stop the  medication if she misses her cycle and do a pregnancy test.   Will start Phentermine today. Checked the PDMP, and controlled medication sheet for Phentermine, reviewed and signed with the patient. She denies contraindications. Benefits and risks were discussed.   Rx: Phentermine 30 mg 1 daily in the AM #30 with no refills.     Generalized Obesity: Current BMI BMI (Calculated): 31.42   Pharmacotherapy Plan Start  Phentermine 30 mg by mouth daily in am  Sandra Alvarado is currently in the action stage of change. As such, her goal is to continue with weight loss efforts.  She has agreed to the Category 2 plan.  Exercise goals: All adults should avoid inactivity. Some physical activity is better than none, and adults who participate in any amount of physical activity gain some health benefits. She is walking more.   Behavioral modification strategies: increasing lean protein intake, decreasing simple carbohydrates , no meal skipping, meal planning , increase water intake, better snacking choices, planning for success, ways to avoid night time snacking, avoiding temptations, keep healthy foods in  the home, and mindful eating.  Sandra Alvarado has agreed to follow-up with our clinic in 4 weeks.    Objective:   VITALS: Per patient if applicable, see vitals. GENERAL: Alert and in no acute distress. CARDIOPULMONARY: No increased WOB. Speaking in clear sentences.  PSYCH: Pleasant and cooperative. Speech normal rate and rhythm. Affect is appropriate. Insight and judgement are appropriate. Attention is focused, linear, and appropriate.  NEURO: Oriented as arrived to appointment on time with no prompting.   Attestation Statements:   This was prepared with the assistance of Engineer, Civil (consulting).  Occasional wrong-word or sound-a-like substitutions may have occurred due to the inherent limitations of voice recognition   Clayborne Daring, DO

## 2024-07-02 ENCOUNTER — Telehealth: Payer: Self-pay

## 2024-07-02 NOTE — Telephone Encounter (Signed)
Started PA for Phentermine via cover my meds.

## 2024-07-03 NOTE — Telephone Encounter (Signed)
 Phentermine approved through 07/02/2025.

## 2024-07-25 ENCOUNTER — Ambulatory Visit: Admitting: Bariatrics

## 2024-08-07 ENCOUNTER — Ambulatory Visit: Admitting: Bariatrics

## 2024-08-27 ENCOUNTER — Ambulatory Visit: Admitting: Bariatrics

## 2024-09-19 ENCOUNTER — Ambulatory Visit: Admitting: Bariatrics

## 2024-09-19 ENCOUNTER — Encounter: Payer: Self-pay | Admitting: Bariatrics

## 2024-09-19 VITALS — BP 93/58 | HR 70 | Ht 61.5 in | Wt 163.0 lb

## 2024-09-19 DIAGNOSIS — E669 Obesity, unspecified: Secondary | ICD-10-CM | POA: Diagnosis not present

## 2024-09-19 DIAGNOSIS — E6609 Other obesity due to excess calories: Secondary | ICD-10-CM

## 2024-09-19 DIAGNOSIS — E7800 Pure hypercholesterolemia, unspecified: Secondary | ICD-10-CM | POA: Diagnosis not present

## 2024-09-19 DIAGNOSIS — R632 Polyphagia: Secondary | ICD-10-CM | POA: Diagnosis not present

## 2024-09-19 DIAGNOSIS — Z6830 Body mass index (BMI) 30.0-30.9, adult: Secondary | ICD-10-CM | POA: Diagnosis not present

## 2024-09-19 MED ORDER — PHENTERMINE HCL 30 MG PO CAPS: 30.0000 mg | ORAL_CAPSULE | ORAL | 0 refills | Status: AC

## 2024-09-19 NOTE — Progress Notes (Signed)
 "                                                                                                             WEIGHT SUMMARY AND BIOMETRICS  Weight Lost Since Last Visit: 6lb  Weight Gained Since Last Visit: 0   Vitals BP: (!) 93/58 Pulse Rate: 70 SpO2: 98 %   Anthropometric Measurements Height: 5' 1.5 (1.562 m) Weight: 163 lb (73.9 kg) BMI (Calculated): 30.3 Weight at Last Visit: 169lb Weight Lost Since Last Visit: 6lb Weight Gained Since Last Visit: 0 Starting Weight: 184lb Total Weight Loss (lbs): 21 lb (9.526 kg)   Body Composition  Body Fat %: 41.9 % Fat Mass (lbs): 68.4 lbs Muscle Mass (lbs): 90 lbs Total Body Water (lbs): 69 lbs Visceral Fat Rating : 8   Other Clinical Data Fasting: no Labs: no Today's Visit #: 6 Starting Date: 02/07/24    OBESITY Sandra Alvarado is here to discuss her progress with her obesity treatment plan along with follow-up of her obesity related diagnoses.    Nutrition Plan: the Category 2 plan - 50-60% adherence.  Current exercise: none  Interim History:  She is down 6 lbs since her last visit in November.  Eating all of the food on the plan., Protein intake is as prescribed, Is not skipping meals, and Water intake is inadequate.   Pharmacotherapy: Sandra Alvarado is on Phentermine  30 mg by mouth daily in am Adverse side effects: dry mouth  Hunger is moderately controlled.  Cravings are moderately controlled.  Assessment/Plan:   Sandra Alvarado endorses excessive hunger.  Medication(s): Phentermine  30 mg  Effects of medication:  moderately controlled. Cravings are moderately controlled.   Plan: Medication(s): Phentermine  30 mg by mouth daily in am Will increase water, protein and fiber to help assuage hunger.  Will minimize foods that have a high glucose index/load to minimize reactive hypoglycemia.  Will increase her water intake.   Elevated cholesterol:  She had elevated cholesterol in the past .   Plan:  Will get her  labs done through her PCP.    Generalized Obesity: Current BMI BMI (Calculated): 30.3   Pharmacotherapy Plan Continue and refill  Phentermine  30 mg by mouth daily in am  Sandra Alvarado is currently in the action stage of change. As such, her goal is to continue with weight loss efforts.  She has agreed to the Category 2 plan.  Exercise goals: All adults should avoid inactivity. Some physical activity is better than none, and adults who participate in any amount of physical activity gain some health benefits.  Behavioral modification strategies: increasing lean protein intake, no meal skipping, meal planning , increase water intake, better snacking choices, planning for success, increasing vegetables, increasing fiber rich foods, weigh protein portions, measure portion sizes, and mindful eating.  Sandra Alvarado has agreed to follow-up with our clinic in 4 weeks.   Objective:   VITALS: Per patient if applicable, see vitals. GENERAL: Alert and in no acute distress. CARDIOPULMONARY: No increased WOB. Speaking in clear sentences.  PSYCH: Pleasant and cooperative. Speech normal  rate and rhythm. Affect is appropriate. Insight and judgement are appropriate. Attention is focused, linear, and appropriate.  NEURO: Oriented as arrived to appointment on time with no prompting.   Attestation Statements:   This was prepared with the assistance of Engineer, Civil (consulting).  Occasional wrong-word or sound-a-like substitutions may have occurred due to the inherent limitations of voice recognition.   Clayborne Daring, DO    "

## 2024-10-05 ENCOUNTER — Encounter: Admitting: Family

## 2024-10-23 ENCOUNTER — Ambulatory Visit: Admitting: Bariatrics

## 2024-11-14 ENCOUNTER — Encounter: Admitting: Family
